# Patient Record
Sex: Male | Born: 1955 | Race: White | Hispanic: No | Marital: Married | Smoking: Never smoker
Health system: Southern US, Community
[De-identification: ages and names within clinical notes are randomized; demographics above are authoritative.]

## PROBLEM LIST (undated history)

## (undated) DIAGNOSIS — T4145XA Adverse effect of unspecified anesthetic, initial encounter: Secondary | ICD-10-CM

## (undated) DIAGNOSIS — G4730 Sleep apnea, unspecified: Secondary | ICD-10-CM

## (undated) DIAGNOSIS — I10 Essential (primary) hypertension: Secondary | ICD-10-CM

## (undated) DIAGNOSIS — N189 Chronic kidney disease, unspecified: Secondary | ICD-10-CM

## (undated) DIAGNOSIS — C801 Malignant (primary) neoplasm, unspecified: Secondary | ICD-10-CM

## (undated) DIAGNOSIS — M1990 Unspecified osteoarthritis, unspecified site: Secondary | ICD-10-CM

## (undated) DIAGNOSIS — D649 Anemia, unspecified: Secondary | ICD-10-CM

## (undated) DIAGNOSIS — T8859XA Other complications of anesthesia, initial encounter: Secondary | ICD-10-CM

## (undated) DIAGNOSIS — E119 Type 2 diabetes mellitus without complications: Secondary | ICD-10-CM

## (undated) HISTORY — PX: CHOLECYSTECTOMY: SHX55

## (undated) HISTORY — PX: MOHS SURGERY: SHX181

---
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
## 2004-04-14 ENCOUNTER — Ambulatory Visit: Payer: Self-pay

## 2004-09-23 ENCOUNTER — Ambulatory Visit: Payer: Self-pay | Admitting: Internal Medicine

## 2014-05-30 DIAGNOSIS — C61 Malignant neoplasm of prostate: Secondary | ICD-10-CM | POA: Insufficient documentation

## 2014-08-27 DIAGNOSIS — Z9989 Dependence on other enabling machines and devices: Secondary | ICD-10-CM | POA: Insufficient documentation

## 2016-03-09 DIAGNOSIS — E1121 Type 2 diabetes mellitus with diabetic nephropathy: Secondary | ICD-10-CM | POA: Insufficient documentation

## 2016-05-10 ENCOUNTER — Other Ambulatory Visit: Payer: Self-pay | Admitting: Orthopedic Surgery

## 2016-05-10 DIAGNOSIS — G8929 Other chronic pain: Secondary | ICD-10-CM

## 2016-05-10 DIAGNOSIS — M25561 Pain in right knee: Principal | ICD-10-CM

## 2016-05-11 ENCOUNTER — Ambulatory Visit: Payer: BLUE CROSS/BLUE SHIELD | Attending: Specialist

## 2016-05-11 DIAGNOSIS — G4733 Obstructive sleep apnea (adult) (pediatric): Secondary | ICD-10-CM | POA: Diagnosis not present

## 2016-05-11 DIAGNOSIS — G4730 Sleep apnea, unspecified: Secondary | ICD-10-CM | POA: Diagnosis present

## 2016-05-11 DIAGNOSIS — R0683 Snoring: Secondary | ICD-10-CM | POA: Diagnosis not present

## 2016-05-20 ENCOUNTER — Ambulatory Visit
Admission: RE | Admit: 2016-05-20 | Discharge: 2016-05-20 | Disposition: A | Discharge status: Home / Self Care | Payer: BLUE CROSS/BLUE SHIELD | Source: Ambulatory Visit | Attending: Orthopedic Surgery | Admitting: Orthopedic Surgery

## 2016-05-20 DIAGNOSIS — M7041 Prepatellar bursitis, right knee: Secondary | ICD-10-CM | POA: Insufficient documentation

## 2016-05-20 DIAGNOSIS — M7121 Synovial cyst of popliteal space [Baker], right knee: Secondary | ICD-10-CM | POA: Diagnosis not present

## 2016-05-20 DIAGNOSIS — M1711 Unilateral primary osteoarthritis, right knee: Secondary | ICD-10-CM | POA: Diagnosis not present

## 2016-05-20 DIAGNOSIS — M25561 Pain in right knee: Secondary | ICD-10-CM | POA: Insufficient documentation

## 2016-05-20 DIAGNOSIS — I739 Peripheral vascular disease, unspecified: Secondary | ICD-10-CM | POA: Diagnosis not present

## 2016-05-20 DIAGNOSIS — G8929 Other chronic pain: Secondary | ICD-10-CM | POA: Diagnosis present

## 2016-06-23 ENCOUNTER — Encounter
Admission: RE | Admit: 2016-06-23 | Discharge: 2016-06-23 | Disposition: A | Discharge status: Home / Self Care | Payer: BLUE CROSS/BLUE SHIELD | Source: Ambulatory Visit | Attending: Orthopedic Surgery | Admitting: Orthopedic Surgery

## 2016-06-23 DIAGNOSIS — I10 Essential (primary) hypertension: Secondary | ICD-10-CM | POA: Diagnosis not present

## 2016-06-23 DIAGNOSIS — Z01810 Encounter for preprocedural cardiovascular examination: Secondary | ICD-10-CM | POA: Insufficient documentation

## 2016-06-23 DIAGNOSIS — Z01812 Encounter for preprocedural laboratory examination: Secondary | ICD-10-CM | POA: Diagnosis present

## 2016-06-23 HISTORY — DX: Sleep apnea, unspecified: G47.30

## 2016-06-23 HISTORY — DX: Essential (primary) hypertension: I10

## 2016-06-23 HISTORY — DX: Unspecified osteoarthritis, unspecified site: M19.90

## 2016-06-23 HISTORY — DX: Malignant (primary) neoplasm, unspecified: C80.1

## 2016-06-23 LAB — PROTIME-INR
INR: 0.97
PROTHROMBIN TIME: 12.9 s (ref 11.4–15.2)

## 2016-06-23 LAB — CBC
HCT: 36.7 % — ABNORMAL LOW (ref 40.0–52.0)
HEMOGLOBIN: 12.9 g/dL — AB (ref 13.0–18.0)
MCH: 31.3 pg (ref 26.0–34.0)
MCHC: 35.1 g/dL (ref 32.0–36.0)
MCV: 89 fL (ref 80.0–100.0)
PLATELETS: 240 10*3/uL (ref 150–440)
RBC: 4.12 MIL/uL — AB (ref 4.40–5.90)
RDW: 14.1 % (ref 11.5–14.5)
WBC: 6.5 10*3/uL (ref 3.8–10.6)

## 2016-06-23 LAB — URINALYSIS, COMPLETE (UACMP) WITH MICROSCOPIC
BILIRUBIN URINE: NEGATIVE
Bacteria, UA: NONE SEEN
Glucose, UA: 50 mg/dL — AB
Hgb urine dipstick: NEGATIVE
KETONES UR: NEGATIVE mg/dL
Leukocytes, UA: NEGATIVE
Nitrite: NEGATIVE
RBC / HPF: NONE SEEN RBC/hpf (ref 0–5)
Specific Gravity, Urine: 1.009 (ref 1.005–1.030)
pH: 7 (ref 5.0–8.0)

## 2016-06-23 LAB — TYPE AND SCREEN
ABO/RH(D): AB POS
Antibody Screen: NEGATIVE

## 2016-06-23 LAB — SEDIMENTATION RATE: Sed Rate: 39 mm/hr — ABNORMAL HIGH (ref 0–20)

## 2016-06-23 LAB — BASIC METABOLIC PANEL
ANION GAP: 8 (ref 5–15)
BUN: 19 mg/dL (ref 6–20)
CALCIUM: 9 mg/dL (ref 8.9–10.3)
CO2: 28 mmol/L (ref 22–32)
Chloride: 103 mmol/L (ref 101–111)
Creatinine, Ser: 1.14 mg/dL (ref 0.61–1.24)
GFR calc Af Amer: >60 mL/min (ref >60)
GFR calc non Af Amer: >60 mL/min (ref >60)
Glucose, Bld: 148 mg/dL — ABNORMAL HIGH (ref 65–99)
POTASSIUM: 3.2 mmol/L — AB (ref 3.5–5.1)
Sodium: 139 mmol/L (ref 135–145)

## 2016-06-23 LAB — SURGICAL PCR SCREEN
MRSA, PCR: NEGATIVE
Staphylococcus aureus: NEGATIVE

## 2016-06-23 LAB — APTT: APTT: 30 s (ref 24–36)

## 2016-06-23 NOTE — Patient Instructions (Addendum)
Your procedure is scheduled on: 06/29/16 Tues Report to Same Day Surgery 2nd floor medical mall [facility] Entrance-take elevator on left to 2nd floor.  Check in with surgery information desk.) To find out your arrival time please call [phone] between 1PM - 3PM on 06/28/16 Mon Remember: Instructions that are not followed completely may result in serious medical risk, up to and including death, or upon the discretion of your surgeon and anesthesiologist your surgery may need to be rescheduled.    _x___ 1. Do not eat food or drink liquids after midnight. No gum chewing or                              hard candies.     __x__ 2. No Alcohol for 24 hours before or after surgery.   __x__3. No Smoking for 24 prior to surgery.   ____  4. Bring all medications with you on the day of surgery if instructed.    __x__ 5. Notify your doctor if there is any change in your medical condition     (cold, fever, infections).     Do not wear jewelry, make-up, hairpins, clips or nail polish.  Do not wear lotions, powders, or perfumes. You may wear deodorant.  Do not shave 48 hours prior to surgery. Men may shave face and neck.  Do not bring valuables to the hospital.    [facility] is not responsible for any belongings or valuables.               Contacts, dentures or bridgework may not be worn into surgery.  Leave your suitcase in the car. After surgery it may be brought to your room.  For patients admitted to the hospital, discharge time is determined by your                       treatment team.   Patients discharged the day of surgery will not be allowed to drive home.  You will need someone to drive you home and stay with you the night of your procedure.    Please read over the following fact sheets that you were given:   [facility] Preparing for Surgery and or MRSA Information   _x___ Take anti-hypertensive (unless it includes a diuretic), cardiac, seizure, asthma,     anti-reflux and  psychiatric medicines. These include:  1. amLODipine (NORVASC  2.carvedilol (COREG  3.lisinopril (PRINIVIL,ZESTRIL  4.  5.  6.  ____Fleets enema or Magnesium Citrate as directed.   _x___ Use CHG Soap or sage wipes as directed on instruction sheet   ____ Use inhalers on the day of surgery and bring to hospital day of surgery  _x_ Stop Metformin and Janumet 2 days prior to surgery.    ____ Take 1/2 of usual insulin dose the night before surgery and none on the morning     surgery.   _x___ Follow recommendations from Cardiologist, Pulmonologist or PCP regarding          stopping Aspirin, Coumadin, Pllavix ,Eliquis, Effient, or Pradaxa, and Pletal.  X____Stop Anti-inflammatories such as Advil, Aleve, Ibuprofen, Motrin, Naproxen, Naprosyn, Goodies powders or aspirin products. OK to take Tylenol and                          Celebrex.   _x___ Stop supplements until after surgery.  But may continue Vitamin D,  Vitamin B,       and multivitamin.  Stop Turmeric today.   __x__ Bring C-Pap to the hospital.

## 2016-06-24 LAB — URINE CULTURE: Culture: NO GROWTH

## 2016-06-24 NOTE — Pre-Procedure Instructions (Signed)
EKG /HISTORY DISCUSSED WITH DR Assunta Gambles. OK WITH EKG AND JUST WANTS MEDICAL OPTIMIZATION BY PCP. CALLED AND FAXED TO CONNIE AT DR B KLEIN'S OFFICE. LM FOR TIFFANY AT DR [facility]

## 2016-06-28 NOTE — Pre-Procedure Instructions (Signed)
Medical clearance received from Dr. Caryl Comes and patient risk assessment is low.Clearance placed on chart.

## 2016-06-29 ENCOUNTER — Inpatient Hospital Stay: Payer: BLUE CROSS/BLUE SHIELD | Admitting: Anesthesiology

## 2016-06-29 ENCOUNTER — Inpatient Hospital Stay
Admission: RE | Admit: 2016-06-29 | Discharge: 2016-07-01 | DRG: 470 | Disposition: A | Discharge status: Home Health | Payer: BLUE CROSS/BLUE SHIELD | Source: Ambulatory Visit | Attending: Orthopedic Surgery | Admitting: Orthopedic Surgery

## 2016-06-29 ENCOUNTER — Inpatient Hospital Stay: Payer: BLUE CROSS/BLUE SHIELD

## 2016-06-29 ENCOUNTER — Encounter
Admission: RE | Disposition: A | Discharge status: Home Health | Payer: Self-pay | Source: Ambulatory Visit | Attending: Orthopedic Surgery

## 2016-06-29 ENCOUNTER — Encounter: Payer: Self-pay | Admitting: *Deleted

## 2016-06-29 DIAGNOSIS — Z79899 Other long term (current) drug therapy: Secondary | ICD-10-CM

## 2016-06-29 DIAGNOSIS — E1121 Type 2 diabetes mellitus with diabetic nephropathy: Secondary | ICD-10-CM | POA: Diagnosis present

## 2016-06-29 DIAGNOSIS — M1711 Unilateral primary osteoarthritis, right knee: Principal | ICD-10-CM | POA: Diagnosis present

## 2016-06-29 DIAGNOSIS — Z8546 Personal history of malignant neoplasm of prostate: Secondary | ICD-10-CM

## 2016-06-29 DIAGNOSIS — I10 Essential (primary) hypertension: Secondary | ICD-10-CM | POA: Diagnosis present

## 2016-06-29 DIAGNOSIS — Z7984 Long term (current) use of oral hypoglycemic drugs: Secondary | ICD-10-CM

## 2016-06-29 DIAGNOSIS — E876 Hypokalemia: Secondary | ICD-10-CM | POA: Diagnosis not present

## 2016-06-29 DIAGNOSIS — D62 Acute posthemorrhagic anemia: Secondary | ICD-10-CM | POA: Diagnosis not present

## 2016-06-29 DIAGNOSIS — E119 Type 2 diabetes mellitus without complications: Secondary | ICD-10-CM | POA: Diagnosis present

## 2016-06-29 DIAGNOSIS — G8918 Other acute postprocedural pain: Secondary | ICD-10-CM

## 2016-06-29 DIAGNOSIS — G4730 Sleep apnea, unspecified: Secondary | ICD-10-CM | POA: Diagnosis present

## 2016-06-29 HISTORY — PX: TOTAL KNEE ARTHROPLASTY: SHX125

## 2016-06-29 LAB — POCT I-STAT 4, (NA,K, GLUC, HGB,HCT)
GLUCOSE: 154 mg/dL — AB (ref 65–99)
HEMATOCRIT: 33 % — AB (ref 39.0–52.0)
HEMOGLOBIN: 11.2 g/dL — AB (ref 13.0–17.0)
POTASSIUM: 3.6 mmol/L (ref 3.5–5.1)
Sodium: 141 mmol/L (ref 135–145)

## 2016-06-29 LAB — CBC
HCT: 34.8 % — ABNORMAL LOW (ref 40.0–52.0)
HEMOGLOBIN: 12.1 g/dL — AB (ref 13.0–18.0)
MCH: 31.2 pg (ref 26.0–34.0)
MCHC: 34.7 g/dL (ref 32.0–36.0)
MCV: 89.9 fL (ref 80.0–100.0)
Platelets: 223 10*3/uL (ref 150–440)
RBC: 3.87 MIL/uL — ABNORMAL LOW (ref 4.40–5.90)
RDW: 13.8 % (ref 11.5–14.5)
WBC: 15.4 10*3/uL — AB (ref 3.8–10.6)

## 2016-06-29 LAB — GLUCOSE, CAPILLARY
GLUCOSE-CAPILLARY: 147 mg/dL — AB (ref 65–99)
Glucose-Capillary: 146 mg/dL — ABNORMAL HIGH (ref 65–99)

## 2016-06-29 LAB — CREATININE, SERUM
Creatinine, Ser: 1.38 mg/dL — ABNORMAL HIGH (ref 0.61–1.24)
GFR calc Af Amer: >60 mL/min (ref >60)
GFR calc non Af Amer: 54 mL/min — ABNORMAL LOW (ref >60)

## 2016-06-29 LAB — ABO/RH: ABO/RH(D): AB POS

## 2016-06-29 SURGERY — ARTHROPLASTY, KNEE, TOTAL
Anesthesia: Spinal | Site: Knee | Laterality: Right | Wound class: Clean

## 2016-06-29 MED ORDER — BUPIVACAINE HCL (PF) 0.5 % IJ SOLN
INTRAMUSCULAR | Status: AC
  Filled 2016-06-29: qty 10

## 2016-06-29 MED ORDER — METOCLOPRAMIDE HCL 10 MG PO TABS: 5.0000 mg | ORAL_TABLET | Freq: Three times a day (TID) | ORAL | Status: DC | PRN

## 2016-06-29 MED ORDER — ALUM & MAG HYDROXIDE-SIMETH 200-200-20 MG/5ML PO SUSP
30.0000 mL | ORAL | Status: DC | PRN
  Administered 2016-06-30 – 2016-07-01 (×3): 30 mL via ORAL
  Filled 2016-06-29 (×3): qty 30

## 2016-06-29 MED ORDER — NEOMYCIN-POLYMYXIN B GU 40-200000 IR SOLN
Status: AC
Start: 2016-06-29 — End: 2016-06-29
  Filled 2016-06-29: qty 20

## 2016-06-29 MED ORDER — METFORMIN HCL 500 MG PO TABS
1000.0000 mg | ORAL_TABLET | Freq: Every day | ORAL | Status: DC
  Administered 2016-06-29 – 2016-07-01 (×3): 1000 mg via ORAL
  Filled 2016-06-29 (×3): qty 2

## 2016-06-29 MED ORDER — SODIUM CHLORIDE 0.9 % IJ SOLN
INTRAMUSCULAR | Status: AC
  Filled 2016-06-29: qty 10

## 2016-06-29 MED ORDER — FAMOTIDINE 20 MG PO TABS
ORAL_TABLET | ORAL | Status: AC
  Filled 2016-06-29: qty 1

## 2016-06-29 MED ORDER — CEFAZOLIN SODIUM-DEXTROSE 2-3 GM-% IV SOLR
2.0000 g | Freq: Four times a day (QID) | INTRAVENOUS | Status: AC
  Administered 2016-06-29 (×3): 2 g via INTRAVENOUS
  Filled 2016-06-29 (×3): qty 50

## 2016-06-29 MED ORDER — GLYCOPYRROLATE 0.2 MG/ML IJ SOLN
INTRAMUSCULAR | Status: AC
  Filled 2016-06-29: qty 1

## 2016-06-29 MED ORDER — FAMOTIDINE 20 MG PO TABS
20.0000 mg | ORAL_TABLET | Freq: Once | ORAL | Status: AC
  Administered 2016-06-29: 20 mg via ORAL

## 2016-06-29 MED ORDER — BUPIVACAINE-EPINEPHRINE (PF) 0.25% -1:200000 IJ SOLN
INTRAMUSCULAR | Status: AC
  Filled 2016-06-29: qty 30

## 2016-06-29 MED ORDER — AMLODIPINE BESYLATE 5 MG PO TABS
5.0000 mg | ORAL_TABLET | Freq: Two times a day (BID) | ORAL | Status: DC
  Administered 2016-06-30 – 2016-07-01 (×3): 5 mg via ORAL
  Filled 2016-06-29 (×3): qty 1

## 2016-06-29 MED ORDER — MIDAZOLAM HCL 2 MG/2ML IJ SOLN
INTRAMUSCULAR | Status: AC
  Filled 2016-06-29: qty 2

## 2016-06-29 MED ORDER — CARVEDILOL 25 MG PO TABS
50.0000 mg | ORAL_TABLET | Freq: Two times a day (BID) | ORAL | Status: DC
  Administered 2016-06-29 – 2016-07-01 (×5): 50 mg via ORAL
  Filled 2016-06-29 (×5): qty 2

## 2016-06-29 MED ORDER — EPHEDRINE SULFATE 50 MG/ML IJ SOLN
INTRAMUSCULAR | Status: AC
  Filled 2016-06-29: qty 1

## 2016-06-29 MED ORDER — METOCLOPRAMIDE HCL 5 MG/ML IJ SOLN: 5.0000 mg | Freq: Three times a day (TID) | INTRAMUSCULAR | Status: DC | PRN

## 2016-06-29 MED ORDER — MAGNESIUM HYDROXIDE 400 MG/5ML PO SUSP
30.0000 mL | Freq: Every day | ORAL | Status: DC | PRN
  Administered 2016-06-30 – 2016-07-01 (×2): 30 mL via ORAL
  Filled 2016-06-29 (×3): qty 30

## 2016-06-29 MED ORDER — PROPOFOL 500 MG/50ML IV EMUL
INTRAVENOUS | Status: DC | PRN
Start: 2016-06-29 — End: 2016-06-29
  Administered 2016-06-29: 50 ug/kg/min via INTRAVENOUS

## 2016-06-29 MED ORDER — MIDAZOLAM HCL 5 MG/5ML IJ SOLN
INTRAMUSCULAR | Status: DC | PRN
  Administered 2016-06-29: 2 mg via INTRAVENOUS
  Administered 2016-06-29 (×2): 1 mg via INTRAVENOUS

## 2016-06-29 MED ORDER — FENTANYL CITRATE (PF) 100 MCG/2ML IJ SOLN
INTRAMUSCULAR | Status: DC | PRN
  Administered 2016-06-29 (×2): 50 ug via INTRAVENOUS

## 2016-06-29 MED ORDER — BUPIVACAINE-EPINEPHRINE (PF) 0.25% -1:200000 IJ SOLN
INTRAMUSCULAR | Status: DC | PRN
  Administered 2016-06-29: 30 mL

## 2016-06-29 MED ORDER — GLYCOPYRROLATE 0.2 MG/ML IJ SOLN
INTRAMUSCULAR | Status: DC | PRN
  Administered 2016-06-29: 0.2 mg via INTRAVENOUS

## 2016-06-29 MED ORDER — DIPHENHYDRAMINE HCL 12.5 MG/5ML PO ELIX: 12.5000 mg | ORAL_SOLUTION | ORAL | Status: DC | PRN

## 2016-06-29 MED ORDER — SODIUM CHLORIDE 0.9 % IV SOLN
INTRAVENOUS | Status: DC
  Administered 2016-06-29 (×2): via INTRAVENOUS

## 2016-06-29 MED ORDER — SODIUM CHLORIDE 0.9 % IJ SOLN
INTRAMUSCULAR | Status: AC
Start: 2016-06-29 — End: 2016-06-29
  Filled 2016-06-29: qty 100

## 2016-06-29 MED ORDER — BISACODYL 10 MG RE SUPP: 10.0000 mg | Freq: Every day | RECTAL | Status: DC | PRN

## 2016-06-29 MED ORDER — MORPHINE SULFATE 10 MG/ML IJ SOLN
INTRAMUSCULAR | Status: DC | PRN
  Administered 2016-06-29: 10 mg

## 2016-06-29 MED ORDER — PHENYLEPHRINE HCL 10 MG/ML IJ SOLN
INTRAMUSCULAR | Status: AC
  Filled 2016-06-29: qty 2

## 2016-06-29 MED ORDER — METHOCARBAMOL 1000 MG/10ML IJ SOLN
500.0000 mg | Freq: Four times a day (QID) | INTRAVENOUS | Status: DC | PRN
  Filled 2016-06-29: qty 5

## 2016-06-29 MED ORDER — LIDOCAINE HCL 2 % EX GEL
CUTANEOUS | Status: DC | PRN
  Administered 2016-06-29: 1 via TOPICAL

## 2016-06-29 MED ORDER — ONDANSETRON HCL 4 MG/2ML IJ SOLN
4.0000 mg | Freq: Four times a day (QID) | INTRAMUSCULAR | Status: DC | PRN
  Administered 2016-06-29 – 2016-06-30 (×2): 4 mg via INTRAVENOUS
  Filled 2016-06-29 (×3): qty 2

## 2016-06-29 MED ORDER — ZOLPIDEM TARTRATE 5 MG PO TABS
5.0000 mg | ORAL_TABLET | Freq: Every evening | ORAL | Status: DC | PRN
  Administered 2016-06-29: 5 mg via ORAL
  Filled 2016-06-29: qty 1

## 2016-06-29 MED ORDER — ENOXAPARIN SODIUM 30 MG/0.3ML ~~LOC~~ SOLN
30.0000 mg | Freq: Two times a day (BID) | SUBCUTANEOUS | Status: DC
  Administered 2016-06-30 – 2016-07-01 (×3): 30 mg via SUBCUTANEOUS
  Filled 2016-06-29 (×3): qty 0.3

## 2016-06-29 MED ORDER — SODIUM CHLORIDE 0.9 % IV SOLN
INTRAVENOUS | Status: DC
  Administered 2016-06-29 (×2): via INTRAVENOUS

## 2016-06-29 MED ORDER — TRANEXAMIC ACID 1000 MG/10ML IV SOLN
1000.0000 mg | INTRAVENOUS | Status: AC
  Administered 2016-06-29: 1000 mg via INTRAVENOUS
  Filled 2016-06-29: qty 10

## 2016-06-29 MED ORDER — PROPOFOL 500 MG/50ML IV EMUL
INTRAVENOUS | Status: AC
  Filled 2016-06-29: qty 50

## 2016-06-29 MED ORDER — DOCUSATE SODIUM 100 MG PO CAPS
100.0000 mg | ORAL_CAPSULE | Freq: Two times a day (BID) | ORAL | Status: DC
Start: 2016-06-29 — End: 2016-07-01
  Administered 2016-06-29 – 2016-07-01 (×4): 100 mg via ORAL
  Filled 2016-06-29 (×4): qty 1

## 2016-06-29 MED ORDER — MAGNESIUM CITRATE PO SOLN
1.0000 | Freq: Once | ORAL | Status: DC | PRN
Start: 2016-06-29 — End: 2016-07-01
  Filled 2016-06-29: qty 296

## 2016-06-29 MED ORDER — SODIUM CHLORIDE 0.9 % IV SOLN
INTRAVENOUS | Status: DC | PRN
  Administered 2016-06-29: 60 mL

## 2016-06-29 MED ORDER — CEFAZOLIN SODIUM-DEXTROSE 2-4 GM/100ML-% IV SOLN
INTRAVENOUS | Status: AC
  Filled 2016-06-29: qty 100

## 2016-06-29 MED ORDER — MORPHINE SULFATE (PF) 2 MG/ML IV SOLN
2.0000 mg | INTRAVENOUS | Status: DC | PRN
  Administered 2016-06-29: 2 mg via INTRAVENOUS
  Filled 2016-06-29: qty 1

## 2016-06-29 MED ORDER — BUPIVACAINE HCL (PF) 0.5 % IJ SOLN
INTRAMUSCULAR | Status: DC | PRN
  Administered 2016-06-29: 3 mL via INTRATHECAL

## 2016-06-29 MED ORDER — METHOCARBAMOL 500 MG PO TABS: 500.0000 mg | ORAL_TABLET | Freq: Four times a day (QID) | ORAL | Status: DC | PRN

## 2016-06-29 MED ORDER — MORPHINE SULFATE (PF) 10 MG/ML IV SOLN
INTRAVENOUS | Status: AC
  Filled 2016-06-29: qty 1

## 2016-06-29 MED ORDER — BUPIVACAINE LIPOSOME 1.3 % IJ SUSP
INTRAMUSCULAR | Status: AC
  Filled 2016-06-29: qty 20

## 2016-06-29 MED ORDER — TRAMADOL HCL 50 MG PO TABS
100.0000 mg | ORAL_TABLET | Freq: Four times a day (QID) | ORAL | Status: DC
  Administered 2016-06-29 – 2016-07-01 (×10): 100 mg via ORAL
  Filled 2016-06-29 (×10): qty 2

## 2016-06-29 MED ORDER — LIDOCAINE HCL 2 % EX GEL
CUTANEOUS | Status: AC
  Filled 2016-06-29: qty 5

## 2016-06-29 MED ORDER — TURMERIC 500 MG PO CAPS: 2.0000 | ORAL_CAPSULE | Freq: Every day | ORAL | Status: DC

## 2016-06-29 MED ORDER — OXYCODONE HCL 5 MG PO TABS: 5.0000 mg | ORAL_TABLET | ORAL | Status: DC | PRN

## 2016-06-29 MED ORDER — LIDOCAINE HCL (PF) 2 % IJ SOLN
INTRAMUSCULAR | Status: AC
  Filled 2016-06-29: qty 2

## 2016-06-29 MED ORDER — MENTHOL 3 MG MT LOZG
1.0000 | LOZENGE | OROMUCOSAL | Status: DC | PRN
  Filled 2016-06-29: qty 9

## 2016-06-29 MED ORDER — ONDANSETRON HCL 4 MG/2ML IJ SOLN: 4.0000 mg | Freq: Once | INTRAMUSCULAR | Status: DC | PRN

## 2016-06-29 MED ORDER — PHENOL 1.4 % MT LIQD
1.0000 | OROMUCOSAL | Status: DC | PRN
  Filled 2016-06-29: qty 177

## 2016-06-29 MED ORDER — HYDRALAZINE HCL 25 MG PO TABS
75.0000 mg | ORAL_TABLET | Freq: Two times a day (BID) | ORAL | Status: DC
  Administered 2016-06-29 – 2016-07-01 (×5): 75 mg via ORAL
  Filled 2016-06-29 (×5): qty 1

## 2016-06-29 MED ORDER — NEOMYCIN-POLYMYXIN B GU 40-200000 IR SOLN
Status: DC | PRN
  Administered 2016-06-29: 16 mL

## 2016-06-29 MED ORDER — FENTANYL CITRATE (PF) 100 MCG/2ML IJ SOLN: 25.0000 ug | INTRAMUSCULAR | Status: DC | PRN

## 2016-06-29 MED ORDER — ACETAMINOPHEN 325 MG PO TABS
650.0000 mg | ORAL_TABLET | Freq: Four times a day (QID) | ORAL | Status: DC | PRN
Start: 2016-06-29 — End: 2016-07-01
  Administered 2016-06-29: 650 mg via ORAL
  Filled 2016-06-29: qty 2

## 2016-06-29 MED ORDER — TRIAMTERENE-HCTZ 37.5-25 MG PO TABS
2.0000 | ORAL_TABLET | Freq: Every day | ORAL | Status: DC
  Administered 2016-06-30: 1 via ORAL
  Filled 2016-06-29: qty 2

## 2016-06-29 MED ORDER — CLONIDINE HCL 0.1 MG PO TABS
0.2000 mg | ORAL_TABLET | Freq: Two times a day (BID) | ORAL | Status: DC | PRN
  Administered 2016-06-29: 0.2 mg via ORAL
  Filled 2016-06-29: qty 2

## 2016-06-29 MED ORDER — FUROSEMIDE 20 MG PO TABS
10.0000 mg | ORAL_TABLET | Freq: Every day | ORAL | Status: DC
  Administered 2016-06-30 – 2016-07-01 (×2): 10 mg via ORAL
  Filled 2016-06-29 (×2): qty 1

## 2016-06-29 MED ORDER — CEFAZOLIN SODIUM-DEXTROSE 2-4 GM/100ML-% IV SOLN
2.0000 g | Freq: Once | INTRAVENOUS | Status: AC
Start: 2016-06-29 — End: 2016-06-29
  Administered 2016-06-29: 2 g via INTRAVENOUS

## 2016-06-29 MED ORDER — LIDOCAINE HCL (PF) 2 % IJ SOLN
INTRAMUSCULAR | Status: DC | PRN
  Administered 2016-06-29: 50 mg

## 2016-06-29 MED ORDER — CEFAZOLIN SODIUM-DEXTROSE 2-4 GM/100ML-% IV SOLN: 2.0000 g | Freq: Four times a day (QID) | INTRAVENOUS | Status: DC

## 2016-06-29 MED ORDER — SALINE SPRAY 0.65 % NA SOLN
1.0000 | Freq: Every evening | NASAL | Status: DC | PRN
  Filled 2016-06-29: qty 44

## 2016-06-29 MED ORDER — LISINOPRIL 20 MG PO TABS
20.0000 mg | ORAL_TABLET | Freq: Every day | ORAL | Status: DC
  Administered 2016-06-30 – 2016-07-01 (×2): 20 mg via ORAL
  Filled 2016-06-29 (×2): qty 1

## 2016-06-29 MED ORDER — PROPOFOL 10 MG/ML IV BOLUS
INTRAVENOUS | Status: AC
Start: 2016-06-29 — End: 2016-06-29
  Filled 2016-06-29: qty 20

## 2016-06-29 MED ORDER — ACETAMINOPHEN 650 MG RE SUPP: 650.0000 mg | Freq: Four times a day (QID) | RECTAL | Status: DC | PRN

## 2016-06-29 MED ORDER — ONDANSETRON HCL 4 MG PO TABS: 4.0000 mg | ORAL_TABLET | Freq: Four times a day (QID) | ORAL | Status: DC | PRN

## 2016-06-29 MED ORDER — FENTANYL CITRATE (PF) 100 MCG/2ML IJ SOLN
INTRAMUSCULAR | Status: AC
  Filled 2016-06-29: qty 2

## 2016-06-29 SURGICAL SUPPLY — 65 items
BANDAGE ACE 6X5 VEL STRL LF (GAUZE/BANDAGES/DRESSINGS) ×2 IMPLANT
BLADE SAW 1 (BLADE) ×2 IMPLANT
BLOCK CUTTING TIBIAL 4 RT MIS (MISCELLANEOUS) IMPLANT
BLOCK CUTTING TIBIAL 5 RT (MISCELLANEOUS) IMPLANT
BOWL CEMENT MIXING ADV NOZZLE (MISCELLANEOUS) ×1 IMPLANT
CANISTER SUCT 1200ML W/VALVE (MISCELLANEOUS) ×2 IMPLANT
CANISTER SUCT 3000ML (MISCELLANEOUS) ×4 IMPLANT
CAPT KNEE TOTAL 3 ×1 IMPLANT
CATH FOL LEG HOLDER (MISCELLANEOUS) ×2 IMPLANT
CATH TRAY METER 16FR LF (MISCELLANEOUS) ×2 IMPLANT
CEMENT BONE GENTAMICIN (Cement) ×4 IMPLANT
CEMENT BONE GENTAMICIN PWDR (Cement) IMPLANT
CHLORAPREP W/TINT 26ML (MISCELLANEOUS) ×4 IMPLANT
COOLER POLAR GLACIER W/PUMP (MISCELLANEOUS) ×2 IMPLANT
CUFF TOURN 24 STER (MISCELLANEOUS) IMPLANT
CUFF TOURN 30 STER DUAL PORT (MISCELLANEOUS) ×1 IMPLANT
DRAPE INCISE IOBAN 66X45 STRL (DRAPES) ×4 IMPLANT
DRAPE SHEET LG 3/4 BI-LAMINATE (DRAPES) ×4 IMPLANT
ELECT CAUTERY BLADE 6.4 (BLADE) ×2 IMPLANT
ELECT REM PT RETURN 9FT ADLT (ELECTROSURGICAL) ×2
ELECTRODE REM PT RTRN 9FT ADLT (ELECTROSURGICAL) ×1 IMPLANT
FEMUR CUTTING BLOCK IMPLANT
GAUZE PETRO XEROFOAM 1X8 (MISCELLANEOUS) ×2 IMPLANT
GAUZE SPONGE 4X4 12PLY STRL (GAUZE/BANDAGES/DRESSINGS) ×2 IMPLANT
GLOVE BIOGEL PI IND STRL 9 (GLOVE) ×1 IMPLANT
GLOVE BIOGEL PI INDICATOR 9 (GLOVE) ×1
GLOVE INDICATOR 8.0 STRL GRN (GLOVE) ×2 IMPLANT
GLOVE SURG ORTHO 8.0 STRL STRW (GLOVE) ×2 IMPLANT
GLOVE SURG SYN 9.0  PF PI (GLOVE) ×1
GLOVE SURG SYN 9.0 PF PI (GLOVE) ×1 IMPLANT
GOWN SRG 2XL LVL 4 RGLN SLV (GOWNS) ×1 IMPLANT
GOWN STRL NON-REIN 2XL LVL4 (GOWNS) ×2
GOWN STRL REUS W/ TWL LRG LVL3 (GOWN DISPOSABLE) ×1 IMPLANT
GOWN STRL REUS W/ TWL XL LVL3 (GOWN DISPOSABLE) ×1 IMPLANT
GOWN STRL REUS W/TWL LRG LVL3 (GOWN DISPOSABLE) ×2
GOWN STRL REUS W/TWL XL LVL3 (GOWN DISPOSABLE) ×2
HANDPIECE INTERPULSE COAX TIP (DISPOSABLE) ×2
HOOD PEEL AWAY FLYTE STAYCOOL (MISCELLANEOUS) ×4 IMPLANT
IMMBOLIZER KNEE 19 BLUE UNIV (SOFTGOODS) ×2 IMPLANT
KIT RM TURNOVER STRD PROC AR (KITS) ×2 IMPLANT
KNEE MEDACTA TIBIAL/FEMORAL BL (Knees) ×1 IMPLANT
KNIFE SCULPS 14X20 (INSTRUMENTS) ×2 IMPLANT
NDL SAFETY 18GX1.5 (NEEDLE) ×2 IMPLANT
NDL SPNL 18GX3.5 QUINCKE PK (NEEDLE) ×1 IMPLANT
NDL SPNL 20GX3.5 QUINCKE YW (NEEDLE) ×1 IMPLANT
NEEDLE SPNL 18GX3.5 QUINCKE PK (NEEDLE) ×2 IMPLANT
NEEDLE SPNL 20GX3.5 QUINCKE YW (NEEDLE) ×2 IMPLANT
NS IRRIG 1000ML POUR BTL (IV SOLUTION) ×2 IMPLANT
PACK TOTAL KNEE (MISCELLANEOUS) ×2 IMPLANT
PAD WRAPON POLAR KNEE (MISCELLANEOUS) ×1 IMPLANT
SET HNDPC FAN SPRY TIP SCT (DISPOSABLE) ×1 IMPLANT
SOL .9 NS 3000ML IRR  AL (IV SOLUTION) ×1
SOL .9 NS 3000ML IRR AL (IV SOLUTION) ×1
SOL .9 NS 3000ML IRR UROMATIC (IV SOLUTION) ×1 IMPLANT
STAPLER SKIN PROX 35W (STAPLE) ×2 IMPLANT
SUCTION FRAZIER HANDLE 10FR (MISCELLANEOUS) ×1
SUCTION TUBE FRAZIER 10FR DISP (MISCELLANEOUS) ×1 IMPLANT
SUT DVC 2 QUILL PDO  T11 36X36 (SUTURE) ×1
SUT DVC 2 QUILL PDO T11 36X36 (SUTURE) ×1 IMPLANT
SUT V-LOC 90 ABS DVC 3-0 CL (SUTURE) ×2 IMPLANT
SYR 20CC LL (SYRINGE) ×2 IMPLANT
SYR 50ML LL SCALE MARK (SYRINGE) ×4 IMPLANT
TOWEL OR 17X26 4PK STRL BLUE (TOWEL DISPOSABLE) ×2 IMPLANT
TOWER CARTRIDGE SMART MIX (DISPOSABLE) ×2 IMPLANT
WRAPON POLAR PAD KNEE (MISCELLANEOUS) ×2

## 2016-06-29 NOTE — Anesthesia Preprocedure Evaluation (Signed)
Anesthesia Evaluation  Patient identified by MRN, date of birth, ID band Patient awake    Reviewed: Allergy & Precautions, NPO status , Patient's Chart, lab work & pertinent test results  History of Anesthesia Complications Negative for: history of anesthetic complications  Airway Mallampati: III       Dental   Pulmonary sleep apnea and Continuous Positive Airway Pressure Ventilation ,           Cardiovascular hypertension, Pt. on medications and Pt. on home beta blockers (-) Past MI (-) dysrhythmias (-) Valvular Problems/Murmurs     Neuro/Psych negative neurological ROS     GI/Hepatic negative GI ROS, Neg liver ROS,   Endo/Other  diabetes, Type 2, Oral Hypoglycemic Agents  Renal/GU negative Renal ROS     Musculoskeletal   Abdominal   Peds  Hematology negative hematology ROS (+)   Anesthesia Other Findings   Reproductive/Obstetrics                             Anesthesia Physical Anesthesia Plan  ASA: III  Anesthesia Plan: Spinal   Post-op Pain Management:    Induction:   Airway Management Planned:   Additional Equipment:   Intra-op Plan:   Post-operative Plan:   Informed Consent: I have reviewed the patients History and Physical, chart, labs and discussed the procedure including the risks, benefits and alternatives for the proposed anesthesia with the patient or authorized representative who has indicated his/her understanding and acceptance.     Plan Discussed with:   Anesthesia Plan Comments:         Anesthesia Quick Evaluation

## 2016-06-29 NOTE — OR Nursing (Signed)
Clin Engr in @ [phone] to check C-Pap and took it to PACU

## 2016-06-29 NOTE — Progress Notes (Signed)
Dr. Rudene Christians paged and responded. This Probation officer requested toradol and tramadol for Eric Russell and Eric Russell's wife is concerned about the effects of the ordered opiates on Eric Russell's sleep apnea. Received order for tramadol q6h scheduled. Eric Russell has received iv morphine and po hydralazine, po tylenol and iv zofran since arrival, Eric Russell has had two episodes of greenish emesis. Bone foam removed due to Eric Russell discomfort. Eric Russell has call bell in reach.

## 2016-06-29 NOTE — Anesthesia Post-op Follow-up Note (Cosign Needed)
Anesthesia QCDR form completed.        

## 2016-06-29 NOTE — Progress Notes (Signed)
Pt arrived from the OR at approx 1130 via bed. Pt alert and oriented on arrival, wife at bedside. Pt is placed on continuous pulse ox per order. Pt's wife is concerned about pain control and the use of opiates with pt's sleep apnea. This Probation officer reviewed md orders with pt and his wife. Pt nauseous on arrival, had small amount of green emesis. Pt is on O2 at [facility], lungs clear bilat ,Hr is regular, abdomen is soft, bs hypoactive. Foley draining clear yellow urine. R knee has dressing and polar care intact, provena woundvac also intact. Pt is able ot move his toes on arrival, ted and foot pumps in place. piv #20 intact to l wrist with iv ns hung per md order, site is free of redness and swelling.

## 2016-06-29 NOTE — H&P (Signed)
Reviewed paper H+P, will be scanned into chart. Patient examined, unchanged. No changes noted.

## 2016-06-29 NOTE — NC FL2 (Signed)
[place] LEVEL OF CARE SCREENING TOOL     IDENTIFICATION  Patient Name: Eric Russell Birthdate: 11-04-1955 Sex: male Admission Date (Current Location): 06/29/2016  [place] and [place] Number:  Engineering geologist and Address:  [facility], [address] [place], [place] [address]      Provider Number: [id number]  Attending Physician Name and Address:  Hessie Knows, MD  Relative Name and Phone Number:       Current Level of Care: Hospital Recommended Level of Care: [place] Prior Approval Number:    Date Approved/Denied:   PASRR Number:  [id number])  Discharge Plan: SNF    Current Diagnoses: Patient Active Problem List   Diagnosis Date Noted  . Primary localized osteoarthritis of right knee 06/29/2016    Orientation RESPIRATION BLADDER Height & Weight     Self, Time, Situation, Place  Normal Incontinent, External catheter Weight: 247 lb (112 kg) Height:  5\' 10"  (177.8 cm)  BEHAVIORAL SYMPTOMS/MOOD NEUROLOGICAL BOWEL NUTRITION STATUS   (None. )  (None. ) Incontinent Diet (Diet: Clear Liquid)  AMBULATORY STATUS COMMUNICATION OF NEEDS Skin   Extensive Assist Verbally Surgical wounds (Incision: Right Knee)                       Personal Care Assistance Level of Assistance  Bathing, Feeding, Dressing Bathing Assistance: Limited assistance Feeding assistance: Independent Dressing Assistance: Limited assistance     Functional Limitations Info  Sight, Hearing, Speech Sight Info: Adequate Hearing Info: Adequate Speech Info: Adequate    SPECIAL CARE FACTORS FREQUENCY  PT (By licensed PT), OT (By licensed OT)     PT Frequency:  (5) OT Frequency:  (5)            Contractures      Additional Factors Info  Code Status, Allergies Code Status Info:  (Full Code) Allergies Info:  (No Known Allergies)           Current Medications (06/29/2016):  This is the current hospital active  medication list Current Facility-Administered Medications  Medication Dose Route Frequency Provider Last Rate Last Dose  . 0.9 %  sodium chloride infusion   Intravenous Continuous Hessie Knows, MD      . acetaminophen (TYLENOL) tablet 650 mg  650 mg Oral Q6H PRN Hessie Knows, MD       Or  . acetaminophen (TYLENOL) suppository 650 mg  650 mg Rectal Q6H PRN Hessie Knows, MD      . alum & mag hydroxide-simeth (MAALOX/MYLANTA) 200-200-20 MG/5ML suspension 30 mL  30 mL Oral Q4H PRN Hessie Knows, MD      . Derrill Memo ON 06/30/2016] amLODipine (NORVASC) tablet 5 mg  5 mg Oral BID Hessie Knows, MD      . bisacodyl (DULCOLAX) suppository 10 mg  10 mg Rectal Daily PRN Hessie Knows, MD      . carvedilol (COREG) tablet 50 mg  50 mg Oral BID WC Hessie Knows, MD      . ceFAZolin (ANCEF) IVPB 2 g/50 mL premix  2 g Intravenous Q6H Hessie Knows, MD      . diphenhydrAMINE (BENADRYL) 12.5 MG/5ML elixir 12.5-25 mg  12.5-25 mg Oral Q4H PRN Hessie Knows, MD      . docusate sodium (COLACE) capsule 100 mg  100 mg Oral BID Hessie Knows, MD      . Derrill Memo ON 06/30/2016] enoxaparin (LOVENOX) injection 30 mg  30 mg Subcutaneous Q12H Hessie Knows, MD      .  famotidine (PEPCID) 20 MG tablet           . [START ON 06/30/2016] furosemide (LASIX) tablet 10 mg  10 mg Oral Daily Hessie Knows, MD      . hydrALAZINE (APRESOLINE) tablet 75 mg  75 mg Oral BID Hessie Knows, MD      . Derrill Memo ON 06/30/2016] lisinopril (PRINIVIL,ZESTRIL) tablet 20 mg  20 mg Oral Daily Hessie Knows, MD      . magnesium citrate solution 1 Bottle  1 Bottle Oral Once PRN Hessie Knows, MD      . magnesium hydroxide (MILK OF MAGNESIA) suspension 30 mL  30 mL Oral Daily PRN Hessie Knows, MD      . menthol-cetylpyridinium (CEPACOL) lozenge 3 mg  1 lozenge Oral PRN Hessie Knows, MD       Or  . phenol (CHLORASEPTIC) mouth spray 1 spray  1 spray Mouth/Throat PRN Hessie Knows, MD      . metFORMIN (GLUCOPHAGE) tablet 1,000 mg  1,000 mg Oral Q supper Hessie Knows, MD      .  methocarbamol (ROBAXIN) tablet 500 mg  500 mg Oral Q6H PRN Hessie Knows, MD       Or  . methocarbamol (ROBAXIN) 500 mg in dextrose 5 % 50 mL IVPB  500 mg Intravenous Q6H PRN Hessie Knows, MD      . metoCLOPramide (REGLAN) tablet 5-10 mg  5-10 mg Oral Q8H PRN Hessie Knows, MD       Or  . metoCLOPramide (REGLAN) injection 5-10 mg  5-10 mg Intravenous Q8H PRN Hessie Knows, MD      . morphine 2 MG/ML injection 2 mg  2 mg Intravenous Q1H PRN Hessie Knows, MD      . ondansetron [facility]) tablet 4 mg  4 mg Oral Q6H PRN Hessie Knows, MD       Or  . ondansetron [facility]) injection 4 mg  4 mg Intravenous Q6H PRN Hessie Knows, MD      . oxyCODONE (Oxy IR/ROXICODONE) immediate release tablet 5-10 mg  5-10 mg Oral Q3H PRN Hessie Knows, MD      . sodium chloride (OCEAN) 0.65 % nasal spray 1 spray  1 spray Each Nare QHS PRN Hessie Knows, MD      . Derrill Memo ON 06/30/2016] triamterene-hydrochlorothiazide (MAXZIDE-25) 37.5-25 MG per tablet 2 tablet  2 tablet Oral Daily Hessie Knows, MD      . zolpidem (AMBIEN) tablet 5 mg  5 mg Oral QHS PRN,MR X 1 Hessie Knows, MD         Discharge Medications: Please see discharge summary for a list of discharge medications.  Relevant Imaging Results:  Relevant Lab Results:   Additional Information  (SSN: [id number])  Danie Chandler, Student-Social Work

## 2016-06-29 NOTE — Progress Notes (Signed)
Pt became nauseous after dinner, vomited, received zofran. Pt felt better after emesis. Pt remains oob to chair, call bell in reach. Pt now on room air.

## 2016-06-29 NOTE — Progress Notes (Signed)
PHARMACIST - PHYSICIAN ORDER COMMUNICATION  CONCERNING: P&T Medication Policy on Herbal Medications  DESCRIPTION:  This patient's order for:  Turmeric CAPS 2 capsule  has been noted.  This product(s) is classified as an "herbal" or natural product. Due to a lack of definitive safety studies or FDA approval, nonstandard manufacturing practices, plus the potential risk of unknown drug-drug interactions while on inpatient medications, the Pharmacy and Therapeutics Committee does not permit the use of "herbal" or natural products of this type within [facility].   ACTION TAKEN: The pharmacy department is unable to verify this order at this time and your patient has been informed of this safety policy. Please reevaluate patient's clinical condition at discharge and address if the herbal or natural product(s) should be resumed at that time.  Pernell Dupre, PharmD, BCPS Clinical Pharmacist 06/29/2016 11:28 AM

## 2016-06-29 NOTE — Transfer of Care (Signed)
Immediate Anesthesia Transfer of Care Note  Patient: Golda Acre  Procedure(s) Performed: Procedure(s): TOTAL KNEE ARTHROPLASTY (Right)  Patient Location: PACU  Anesthesia Type:Spinal  Level of Consciousness: awake and alert   Airway & Oxygen Therapy: Patient Spontanous Breathing and Patient connected to face mask oxygen  Post-op Assessment: Report given to RN and Post -op Vital signs reviewed and stable  Post vital signs: Reviewed  Last Vitals:  Vitals:   06/29/16 0559 06/29/16 0957  BP: (!) 156/66 128/74  Pulse: 63 (!) 54  Resp: 16 16  Temp: 36.8 C 36.3 C    Last Pain:  Vitals:   06/29/16 0559  TempSrc: Oral         Complications: No apparent anesthesia complications

## 2016-06-29 NOTE — OR Nursing (Signed)
Lab tech in [phone] for abo/rh draw, and extra tube for POCT K+ (results 3.6)  To OR with warming cap in place, Thigh high stocking on non-op/left leg (other stkg on chart)  Sacral dressing to OR with patient  Clinical Engr called re c-pap  Ancef 2 gm and TXA to OR with patient

## 2016-06-29 NOTE — Evaluation (Signed)
Physical Therapy Evaluation Patient Details Name: Eric Russell MRN: [id number] DOB: Oct 27, 1955 Today's Date: 06/29/2016   History of Present Illness  Pt underwent R TKR without some post-op nausea and vomiting. Pt is POD#0 at time of initial evaluation  Clinical Impression  Pt admitted with above diagnosis. Pt currently with functional limitations due to the deficits listed below (see PT Problem List).  Pt demonstrates good effort with therapist. He requires minA+1 for bed mobility and transfers. He is CGA only for short ambulation from bed to recliner. Pt moves slowly today with decreased weight shift to RLE. He reports decrease in R knee pain from 7/10 to 5/10 following therapy session. Pt will benefit from skilled PT services to address deficits in strength, balance, and mobility in order to return to full function at home.     Follow Up Recommendations Home health PT;Supervision - Intermittent    Equipment Recommendations  Rolling walker with 5" wheels;3in1 (PT)    Recommendations for Other Services OT consult     Precautions / Restrictions Precautions Precautions: Knee Precaution Booklet Issued: Yes (comment) Required Braces or Orthoses: Knee Immobilizer - Right Knee Immobilizer - Right: Discontinue once straight leg raise with < 10 degree lag Restrictions Weight Bearing Restrictions: Yes RLE Weight Bearing: Weight bearing as tolerated      Mobility  Bed Mobility Overal bed mobility: Needs Assistance Bed Mobility: Supine to Sit     Supine to sit: Min assist     General bed mobility comments: Pt requires UE support and cues to come upright to sitting. Denies dizziness with position changes.   Transfers Overall transfer level: Needs assistance Equipment used: Rolling walker (2 wheeled) Transfers: Sit to/from Stand Sit to Stand: Min assist         General transfer comment: Pt requires minA+1 due to weakness in order to come upright to standing. Cues for safe  hand placement during transfers. Once upright pt is steady and stable in standing with bilateral UE support on walker  Ambulation/Gait Ambulation/Gait assistance: Min guard Ambulation Distance (Feet): 5 Feet Assistive device: Rolling walker (2 wheeled) Gait Pattern/deviations: Decreased step length - left;Decreased stance time - right;Decreased weight shift to right Gait velocity: Decreased Gait velocity interpretation: <1.8 ft/sec, indicative of risk for recurrent falls General Gait Details: Pt ambulates from bed to recliner with short, shuffling steps. Heavy UE reliance on rolling walker. Pt educated about proper sequencing with walker during forward and retro ambulation  Stairs            Wheelchair Mobility    Modified Rankin (Stroke Patients Only)       Balance Overall balance assessment: Needs assistance Sitting-balance support: No upper extremity supported Sitting balance-Leahy Scale: Good     Standing balance support: Bilateral upper extremity supported Standing balance-Leahy Scale: Fair Standing balance comment: Able to remove hands from walker with decreased weight shift to RLE                             Pertinent Vitals/Pain Pain Assessment: 0-10 Pain Score: 7  Pain Location: R knee Pain Descriptors / Indicators: Operative site guarding Pain Intervention(s): Monitored during session;Premedicated before session    Home Living Family/patient expects to be discharged to:: Private residence Living Arrangements: Spouse/significant other Available Help at Discharge: Family Type of Home: House Home Access: Level entry     Home Layout: Two level;Bed/bath upstairs Home Equipment: Cane - single point;Toilet riser (no walker, has a  reacher and sock puller, no shower chair)      Prior Function Level of Independence: Independent               Hand Dominance   Dominant Hand: Right    Extremity/Trunk Assessment   Upper Extremity  Assessment Upper Extremity Assessment: Overall WFL for tasks assessed    Lower Extremity Assessment Lower Extremity Assessment: RLE deficits/detail RLE Deficits / Details: LLE strength grossly WFL. Full DF/PF RLE, Able to perform SLR without assist after a couple assisted repetitions. Reports intact sensation to light touch bilaterally.        Communication   Communication: No difficulties  Cognition Arousal/Alertness: Awake/alert Behavior During Therapy: WFL for tasks assessed/performed Overall Cognitive Status: Within Functional Limits for tasks assessed                                        General Comments      Exercises Total Joint Exercises Ankle Circles/Pumps: AROM;Both;10 reps;Supine Quad Sets: Strengthening;Both;10 reps;Supine Gluteal Sets: Strengthening;Both;10 reps;Supine Towel Squeeze: Strengthening;Both;10 reps;Supine Short Arc Quad: Strengthening;Right;10 reps;Supine Heel Slides: Strengthening;Right;10 reps;Supine Hip ABduction/ADduction: Strengthening;Right;10 reps;Supine Straight Leg Raises: Strengthening;Right;10 reps;Supine Goniometric ROM: Not obtained at this time secondary to pain with heel slides. Will measure POD#1   Assessment/Plan    PT Assessment Patient needs continued PT services  PT Problem List Decreased strength;Decreased range of motion;Decreased balance;Decreased mobility;Pain       PT Treatment Interventions DME instruction;Gait training;Functional mobility training;Therapeutic activities;Stair training;Balance training;Therapeutic exercise;Neuromuscular re-education;Patient/family education;Manual techniques    PT Goals (Current goals can be found in the Care Plan section)  Acute Rehab PT Goals Patient Stated Goal: Return to prior function at home with less pain PT Goal Formulation: With patient/family Time For Goal Achievement: 07/13/16 Potential to Achieve Goals: Good    Frequency BID   Barriers to discharge  Inaccessible home environment 14 stairs to get to bedroom/bathroom in 2 level townhome    Co-evaluation               End of Session Equipment Utilized During Treatment: Gait belt Activity Tolerance: Patient tolerated treatment well Patient left: in chair;with call bell/phone within reach;with chair alarm set;with SCD's reapplied;Other (comment) (Towel roll under heel, polar care in place) Nurse Communication: Mobility status PT Visit Diagnosis: Muscle weakness (generalized) (M62.81);Difficulty in walking, not elsewhere classified (R26.2)    Time: 0347-4259 PT Time Calculation (min) (ACUTE ONLY): 37 min   Charges:   PT Evaluation $PT Eval Low Complexity: 1 Procedure PT Treatments $Therapeutic Exercise: 8-22 mins   PT G Codes:        Lyndel Safe Huprich PT, DPT   Huprich,Jason 06/29/2016, 4:38 PM

## 2016-06-29 NOTE — Op Note (Signed)
06/29/2016  10:00 AM  PATIENT:  Eric Russell  61 y.o. male  PRE-OPERATIVE DIAGNOSIS:  PRIMARY OSTEOARTHRITIS OF RIGHT KNEE  POST-OPERATIVE DIAGNOSIS:  PRIMARY OSTEOARTHRITIS OF RIGHT KNEE  PROCEDURE:  Procedure(s): TOTAL KNEE ARTHROPLASTY (Right)  SURGEON: Laurene Footman, MD  ASSISTANTS: [facility]  ANESTHESIA:   spinal  EBL:  Total I/O In: 1800 [I.V.:1800] Out: 750 [Urine:500; Blood:250]  BLOOD ADMINISTERED:none  DRAINS: Incisional wound VAC   LOCAL MEDICATIONS USED:  MARCAINE    and OTHER Exparel and morphine  SPECIMEN:  No Specimen  DISPOSITION OF SPECIMEN:  N/A  COUNTS:  YES  TOURNIQUET:    IMPLANTS: Medacta GMK sphere 5+ femur right, 5 tibia with short stem and 10 mm insert, 3 patella, all components cemented  DICTATION: .Dragon Dictation patient brought the operating room and after adequate spinal anesthesia was obtained the right leg was prepped and draped in sterile fashion was turned by the upper thigh. After patient identification and timeout procedure completed a midline skin incision was made followed by medial parapatellar arthrotomy was a large prepatellar bursa with a large amount of fluid within this was debrided of*the case. Final medial parapatellar arthrotomy the inspection knee revealed tricompartmental severe Oster arthritis with significant bone loss medial compartment and exposed bone and bone loss of the patella. Lateral compartment was also severely arthritic without bone loss. Anterior cruciate ligament PCL and fat pad were incised at this time. The proximal tibia was exposed and a proximal tibia cutting guide applied for the [place] my knee system. Proximal tibia cut carried out followed by distal femoral cut and 4-in-1 cut for the distal femur after excising residual posterior horns of menisci a size 5 tibia plate was placed and preparation with proximal drilling And keel punched performed the 5 femur notch cut was made and the 5+ femoral  trial was placed the 10 mm insert giving good stability and full extension and flexion. These trial components were all removed and the patella cut was a first a rongeur to remove some loose fragments from the lateral aspect and noting significant eburnation of bone loss of the lateral facet. But the patella was measured and resected carried out measured approximately 24 mm in thickness and was cut down to 13 with drill holes made it sized to size 3. At this point tourniquet was raised after the local injection of noted above with the bony surfaces thoroughly irrigated and dried the tibial component was cemented into place first followed by the plastic insert with set screw using the torque screwdriver the femoral component was then cemented in place and held in extension as the patellar button was clamped as well with an a back cement being utilized. After the cemented all set the excess cement was removed using a small osteotome and the knee thoroughly irrigated. Tourniquet was let down lateral release performed because of some tightness to the lateral retinaculum and patella tracked well with no touch technique. The arthrotomy was repaired using a heavy Quill, 3-0V lock subcutaneous suture, skin staples and we will incisional wound VAC applied. 2 Ace wrap for then applied as well secondary to significant peripheral edema there was pre-existing. At the close the case patient center comes stable condition  PLAN OF CARE: Admit to inpatient   PATIENT DISPOSITION:  PACU - hemodynamically stable.

## 2016-06-29 NOTE — Progress Notes (Signed)
Pt remains oob to recliner, eating dinner. Dr. Rudene Christians has rounded on pt, wife at bedside. Pt with no complaints. ivf continues, foley with nearly continual output of clear yellow urine.

## 2016-06-29 NOTE — Anesthesia Procedure Notes (Signed)
Spinal  Patient location during procedure: OR Staffing Anesthesiologist: Gunnar Fusi Resident/CRNA: Rolla Plate Performed: resident/CRNA  Preanesthetic Checklist Completed: patient identified, site marked, surgical consent, pre-op evaluation, timeout performed, IV checked, risks and benefits discussed and monitors and equipment checked Spinal Block Patient position: sitting Prep: ChloraPrep and site prepped and draped Patient monitoring: heart rate, continuous pulse ox, blood pressure and cardiac monitor Approach: midline Location: L4-5 Injection technique: single-shot Needle Needle type: Whitacre and Introducer  Needle gauge: 25 G Needle length: 12.7 cm Additional Notes Negative paresthesia. Negative blood return. Positive free-flowing CSF. Expiration date of kit checked and confirmed. Patient tolerated procedure well, without complications.

## 2016-06-30 LAB — BASIC METABOLIC PANEL
Anion gap: 7 (ref 5–15)
BUN: 21 mg/dL — ABNORMAL HIGH (ref 6–20)
CO2: 28 mmol/L (ref 22–32)
CREATININE: 1.3 mg/dL — AB (ref 0.61–1.24)
Calcium: 8.1 mg/dL — ABNORMAL LOW (ref 8.9–10.3)
Chloride: 105 mmol/L (ref 101–111)
GFR calc Af Amer: >60 mL/min (ref >60)
GFR, EST NON AFRICAN AMERICAN: 58 mL/min — AB (ref >60)
Glucose, Bld: 139 mg/dL — ABNORMAL HIGH (ref 65–99)
Potassium: 2.7 mmol/L — CL (ref 3.5–5.1)
Sodium: 140 mmol/L (ref 135–145)

## 2016-06-30 LAB — CBC
HCT: 31 % — ABNORMAL LOW (ref 40.0–52.0)
Hemoglobin: 10.8 g/dL — ABNORMAL LOW (ref 13.0–18.0)
MCH: 31 pg (ref 26.0–34.0)
MCHC: 34.8 g/dL (ref 32.0–36.0)
MCV: 89.1 fL (ref 80.0–100.0)
PLATELETS: 221 10*3/uL (ref 150–440)
RBC: 3.49 MIL/uL — AB (ref 4.40–5.90)
RDW: 14.1 % (ref 11.5–14.5)
WBC: 11.1 10*3/uL — ABNORMAL HIGH (ref 3.8–10.6)

## 2016-06-30 LAB — POTASSIUM: POTASSIUM: 3.4 mmol/L — AB (ref 3.5–5.1)

## 2016-06-30 MED ORDER — FE FUMARATE-B12-VIT C-FA-IFC PO CAPS
1.0000 | ORAL_CAPSULE | Freq: Three times a day (TID) | ORAL | Status: DC
  Administered 2016-06-30 – 2016-07-01 (×5): 1 via ORAL
  Filled 2016-06-30 (×5): qty 1

## 2016-06-30 MED ORDER — POTASSIUM CHLORIDE CRYS ER 20 MEQ PO TBCR
40.0000 meq | EXTENDED_RELEASE_TABLET | Freq: Two times a day (BID) | ORAL | Status: DC
  Administered 2016-06-30 – 2016-07-01 (×2): 40 meq via ORAL
  Filled 2016-06-30 (×3): qty 2

## 2016-06-30 MED ORDER — POTASSIUM CHLORIDE 20 MEQ PO PACK
40.0000 meq | PACK | Freq: Three times a day (TID) | ORAL | Status: DC
  Administered 2016-06-30: 40 meq via ORAL
  Filled 2016-06-30: qty 2

## 2016-06-30 MED ORDER — TRIAMTERENE-HCTZ 37.5-25 MG PO TABS
1.0000 | ORAL_TABLET | Freq: Every day | ORAL | Status: DC
  Administered 2016-07-01: 1 via ORAL

## 2016-06-30 NOTE — Progress Notes (Signed)
qPhysical Therapy Treatment Patient Details Name: Eric Russell MRN: [id number] DOB: [birth year] Today's Date: 06/30/2016    History of Present Illness Pt underwent R TKR without some post-op nausea and vomiting. PMHx includes arthritis, prostate cancer, HTN, and sleep apnea. Pt is POD 1 this date.    PT Comments    Pt is making good progress towards goals with improved ambulation distance this session. Pt able to follow commands for improved fluid gait sequencing. Good endurance with there-ex and reviewed written HEP, addressed all questions. Pt remains motivated to continue PT, will continue to progress.   Follow Up Recommendations  Home health PT;Supervision - Intermittent     Equipment Recommendations  Rolling walker with 5" wheels;3in1 (PT)    Recommendations for Other Services OT consult     Precautions / Restrictions Precautions Precautions: Knee Precaution Booklet Issued: Yes (comment) Required Braces or Orthoses: Knee Immobilizer - Right Knee Immobilizer - Right: Discontinue once straight leg raise with < 10 degree lag Restrictions Weight Bearing Restrictions: Yes RLE Weight Bearing: Weight bearing as tolerated    Mobility  Bed Mobility Overal bed mobility: Needs Assistance Bed Mobility: Sit to Supine       Sit to supine: Min assist   General bed mobility comments: assist for sliding surgical leg back onto bed. Min assist also required for positioning in bed. Pt able to follow commands well.  Transfers Overall transfer level: Needs assistance Equipment used: Rolling walker (2 wheeled) Transfers: Sit to/from Stand Sit to Stand: Min guard         General transfer comment: Pt able to push from seated surface and stand with RW with improved ease this session. Once standing, able to stand with upright posture  Ambulation/Gait Ambulation/Gait assistance: Min guard Ambulation Distance (Feet): 150 Feet Assistive device: Rolling walker (2 wheeled) Gait  Pattern/deviations: Decreased step length - left;Decreased stance time - right;Decreased weight shift to right Gait velocity: Decreased   General Gait Details: Pt ambulated in hallway able to progress to reciprocal gait pattern with cues. Pt needs tactile cues for quad activation during stance phase and to increased R stance time. Pt also needs cues to look forward during ambulation.   Stairs            Wheelchair Mobility    Modified Rankin (Stroke Patients Only)       Balance                                            Cognition Arousal/Alertness: Awake/alert Behavior During Therapy: WFL for tasks assessed/performed Overall Cognitive Status: Within Functional Limits for tasks assessed                                        Exercises Other Exercises Other Exercises: Seated ther-ex performed including ankle pumps, quad sets, glut sets, SLRs, SAQ, hip abd/add, and LAQ. ALl ther-ex performed x 12 reps on R LE with min assist. Safe technique performed Other Exercises: Pt able to ambulate to bathroom with cga and supervision for hygiene.    General Comments        Pertinent Vitals/Pain Pain Assessment: 0-10 Pain Score: 4  Pain Location: R knee Pain Descriptors / Indicators: Operative site guarding;Aching Pain Intervention(s): Limited activity within patient's tolerance    Home Living  Prior Function            PT Goals (current goals can now be found in the care plan section) Acute Rehab PT Goals Patient Stated Goal: have less pain PT Goal Formulation: With patient/family Time For Goal Achievement: 07/13/16 Potential to Achieve Goals: Good Progress towards PT goals: Progressing toward goals    Frequency    BID      PT Plan Current plan remains appropriate    Co-evaluation             End of Session Equipment Utilized During Treatment: Gait belt Activity Tolerance: Patient  tolerated treatment well Patient left: in bed;with bed alarm set;with family/visitor present Nurse Communication: Mobility status PT Visit Diagnosis: Muscle weakness (generalized) (M62.81);Difficulty in walking, not elsewhere classified (R26.2)     Time: 6283-6629 PT Time Calculation (min) (ACUTE ONLY): 40 min  Charges:  $Gait Training: 23-37 mins $Therapeutic Exercise: 8-22 mins                    G Codes:       Eric Russell, PT, DPT [phone]    Eric Russell 06/30/2016, 5:10 PM

## 2016-06-30 NOTE — Anesthesia Postprocedure Evaluation (Signed)
Anesthesia Post Note  Patient: MARSELINO SLAYTON  Procedure(s) Performed: Procedure(s) (LRB): TOTAL KNEE ARTHROPLASTY (Right)  Patient location during evaluation: Nursing Unit Anesthesia Type: Spinal Level of consciousness: awake, awake and alert and oriented Pain management: pain level controlled Vital Signs Assessment: post-procedure vital signs reviewed and stable Respiratory status: spontaneous breathing, nonlabored ventilation and respiratory function stable Cardiovascular status: stable Postop Assessment: no headache and no backache Anesthetic complications: no     Last Vitals:  Vitals:   06/29/16 2317 06/30/16 0410  BP: (!) 184/77 (!) 160/80  Pulse: 80 75  Resp: 18   Temp: 37.6 C 36.9 C    Last Pain:  Vitals:   06/30/16 0615  TempSrc:   PainSc: 5                  Kyro Joswick,  Charyl Minervini R

## 2016-06-30 NOTE — Progress Notes (Signed)
qPhysical Therapy Treatment Patient Details Name: Eric Russell MRN: [id number] DOB: 1955-09-21 Today's Date: 06/30/2016    History of Present Illness Pt underwent R TKR without some post-op nausea and vomiting. PMHx includes arthritis, prostate cancer, HTN, and sleep apnea. Pt is POD 1 this date.    PT Comments    Pt is making good progress towards goals with improved progress noted with ambulation this date. Pt able to demonstrate heel strike during stepping, however limited to step to gait pattern. Cues for upright posture. Good progress with AAROM this date. Pt appears limited by pain, but continues to be motivated to perform therapy. Will continue to progress. Will need to perform stair training prior to dc home, has 7 stairs followed by landing, then another 7 stairs. Steps have alternating railings.   Follow Up Recommendations  Home health PT;Supervision - Intermittent     Equipment Recommendations  Rolling walker with 5" wheels;3in1 (PT)    Recommendations for Other Services OT consult     Precautions / Restrictions Precautions Precautions: Knee Precaution Booklet Issued: Yes (comment) Required Braces or Orthoses: Knee Immobilizer - Right Knee Immobilizer - Right: Discontinue once straight leg raise with < 10 degree lag Restrictions Weight Bearing Restrictions: Yes RLE Weight Bearing: Weight bearing as tolerated    Mobility  Bed Mobility Overal bed mobility: Needs Assistance Bed Mobility: Supine to Sit     Supine to sit: Min assist     General bed mobility comments: not performed as pt received in recliner  Transfers Overall transfer level: Needs assistance Equipment used: Rolling walker (2 wheeled) Transfers: Sit to/from Stand Sit to Stand: Min assist         General transfer comment: cues given for position of feet prior to standing as well as cues to push from seated surface. Once standing, pt able to maintain balance and upright  posture  Ambulation/Gait Ambulation/Gait assistance: Min guard Ambulation Distance (Feet): 75 Feet Assistive device: Rolling walker (2 wheeled) Gait Pattern/deviations: Decreased step length - left;Decreased stance time - right;Decreased weight shift to right     General Gait Details: Pt ambulates with slow step to gait pattern. Cued for upright posture and to look forward while taking steps. Needs cues for sequencing as he still places heavy B UE weight through RW. No LOB or dizziness noted with ambulation   Stairs            Wheelchair Mobility    Modified Rankin (Stroke Patients Only)       Balance Overall balance assessment: Needs assistance Sitting-balance support: No upper extremity supported Sitting balance-Leahy Scale: Good     Standing balance support: Bilateral upper extremity supported Standing balance-Leahy Scale: Fair                              Cognition Arousal/Alertness: Awake/alert Behavior During Therapy: WFL for tasks assessed/performed Overall Cognitive Status: Within Functional Limits for tasks assessed                                 General Comments: able to follow 1-2 step cues inconsistently requiring additional verbal cues for hand placement during transfers to maximize safety      Exercises Total Joint Exercises Goniometric ROM: R knee AAROM: 14-85 degrees Other Exercises Other Exercises: Pt/spouse educated in functional mobility training with RW to negotiate kitchen space including environmental modifications/set up and  body positioning during functional tasks; pt/spouse verbalized understanding Other Exercises: Seated ther-ex performed including ankle pumps, quad sets, glut sets, SLRs, SAQ, hip abd/add, and seated knee flexion stretches. ALl ther-ex performed x 10-12 reps on R LE with min assist. Safe technique performed    General Comments        Pertinent Vitals/Pain Pain Assessment: 0-10 Pain Score: 6   Pain Location: R knee Pain Descriptors / Indicators: Operative site guarding;Aching Pain Intervention(s): Limited activity within patient's tolerance;RN gave pain meds during session;Ice applied    Home Living Family/patient expects to be discharged to:: Private residence Living Arrangements: Spouse/significant other Available Help at Discharge: Family;Available 24 hours/day;Available PRN/intermittently (wife available 24/7 until 4/2, then other family available PRN) Type of Home: House (townhouse) Home Access: Level entry   Home Layout: Two level;Bed/bath upstairs;1/2 bath on main level Home Equipment: Cane - single point;Toilet riser;Adaptive equipment      Prior Function Level of Independence: Independent with assistive device(s)      Comments: Pt reports using SPC for ambulation recently; using AE for LB ADL and requiring assist from wife for socks/shoes due to pain; wife performs all household tasks; pt was driving less recently due to RLE pain, hasn't been able to work recently due to pain (stands for 8+hours a day) but plans to return to work once better; enjoys playing with grandkids, walking dogs several times/day   PT Goals (current goals can now be found in the care plan section) Acute Rehab PT Goals Patient Stated Goal: have less pain PT Goal Formulation: With patient/family Time For Goal Achievement: 07/13/16 Potential to Achieve Goals: Good Progress towards PT goals: Progressing toward goals    Frequency    BID      PT Plan Current plan remains appropriate    Co-evaluation             End of Session Equipment Utilized During Treatment: Gait belt Activity Tolerance: Patient tolerated treatment well Patient left: in chair;with call bell/phone within reach;with chair alarm set;with SCD's reapplied;Other (comment) Nurse Communication: Mobility status PT Visit Diagnosis: Muscle weakness (generalized) (M62.81);Difficulty in walking, not elsewhere classified  (R26.2)     Time: 1610-9604 PT Time Calculation (min) (ACUTE ONLY): 39 min  Charges:  $Gait Training: 23-37 mins $Therapeutic Exercise: 8-22 mins                    G Codes:       Eric Russell, PT, DPT [phone]    Eric Russell 06/30/2016, 10:46 AM

## 2016-06-30 NOTE — Evaluation (Signed)
Occupational Therapy Evaluation Patient Details Name: Eric Russell MRN: [id number] DOB: [birth year] Today's Date: 06/30/2016    History of Present Illness Pt underwent R TKR without some post-op nausea and vomiting. PMHx includes arthritis, prostate cancer, HTN, and sleep apnea. Pt is POD#1 at time of initial OT evaluation.    Clinical Impression   Pt seen for OT evaluation this date, POD#1 s/p R TKR. Pt was modified independent prior to surgery with increasing difficulty with LB ADL due to pain, requiring assist from wife for socks/shoes and unable to work. Pt is eager to return to PLOF with less pain in order to return to driving and working in addition to walking his dogs and playing with his grandchildren. Pt presents with 5/10 pain in R knee, decreased strength/ROM in R knee, decreased activity tolerance/safety awareness, increased need for assist for self care tasks, and increased risk of falls. Pt will benefit from skilled OT services to address noted impairments and functional deficits including education/training in AE for LB ADL, energy conservation strategies, home/routines modifications, and work simplification strategies in order to maximize return to PLOF and minimize risk of falls. Pt would benefit from [facility] services and supervision once medically appropriate for discharge and pending pt's ability to safely negotiate stairs with PT prior to discharge.    Follow Up Recommendations  Home health OT;Supervision - Intermittent    Equipment Recommendations  3 in 1 bedside commode    Recommendations for Other Services       Precautions / Restrictions Precautions Precautions: Knee Required Braces or Orthoses: Knee Immobilizer - Right Knee Immobilizer - Right: Discontinue once straight leg raise with < 10 degree lag Restrictions Weight Bearing Restrictions: No RLE Weight Bearing: Weight bearing as tolerated      Mobility Bed Mobility Overal bed mobility: Needs Assistance Bed  Mobility: Supine to Sit     Supine to sit: Min assist     General bed mobility comments: Pt requires UE support and Min A for RLE mgt to come upright to sitting EOB. Denies dizziness or nausea with position changes.  Transfers Overall transfer level: Needs assistance Equipment used: Rolling walker (2 wheeled) Transfers: Sit to/from Stand Sit to Stand: Min assist         General transfer comment: Pt requires minA+1 due to weakness in order to come upright to standing. Moderate cues required for safe hand placement during transfers. Once upright pt is steady and stable in standing with bilateral UE support on walker    Balance Overall balance assessment: Needs assistance Sitting-balance support: No upper extremity supported Sitting balance-Leahy Scale: Good     Standing balance support: Bilateral upper extremity supported Standing balance-Leahy Scale: Fair                             ADL either performed or assessed with clinical judgement   ADL Overall ADL's : Needs assistance/impaired Eating/Feeding: Sitting;Set up   Grooming: Sitting;Set up   Upper Body Bathing: Sitting;Set up;With caregiver independent assisting   Lower Body Bathing: Sitting/lateral leans;Minimal assistance;With caregiver independent assisting   Upper Body Dressing : Sitting;Set up;Supervision/safety   Lower Body Dressing: Moderate assistance;Sitting/lateral leans;Sit to/from stand;Cueing for safety     Toilet Transfer Details (indicate cue type and reason): deferred due to safety/fatigue         Functional mobility during ADLs: Min guard;Cueing for safety;Cueing for sequencing;Rolling walker General ADL Comments: Pt generally min-mod A for LB ADL, cueing  for safety/sequencing; would benefit from education/training in AE for LB ADL     Vision Baseline Vision/History: Wears glasses Wears Glasses: At all times Patient Visual Report: No change from baseline Vision Assessment?: No  apparent visual deficits     Perception     Praxis Praxis Praxis tested?: Within functional limits    Pertinent Vitals/Pain Pain Assessment: 0-10 Pain Score: 5  Pain Location: R knee Pain Descriptors / Indicators: Operative site guarding;Aching Pain Intervention(s): Limited activity within patient's tolerance;Monitored during session;Premedicated before session;Ice applied;Repositioned     Hand Dominance Right   Extremity/Trunk Assessment Upper Extremity Assessment Upper Extremity Assessment: Overall WFL for tasks assessed   Lower Extremity Assessment Lower Extremity Assessment: Defer to PT evaluation;RLE deficits/detail   Cervical / Trunk Assessment Cervical / Trunk Assessment: Normal   Communication Communication Communication: No difficulties   Cognition Arousal/Alertness: Awake/alert (some grogginess, reports he didn't sleep much last night) Behavior During Therapy: WFL for tasks assessed/performed Overall Cognitive Status: Within Functional Limits for tasks assessed                                 General Comments: able to follow 1-2 step cues inconsistently requiring additional verbal cues for hand placement during transfers to maximize safety   General Comments       Exercises Other Exercises Other Exercises: Pt/spouse educated in functional mobility training with RW to negotiate kitchen space including environmental modifications/set up and body positioning during functional tasks; pt/spouse verbalized understanding   Shoulder Instructions      Home Living Family/patient expects to be discharged to:: Private residence Living Arrangements: Spouse/significant other Available Help at Discharge: Family;Available 24 hours/day;Available PRN/intermittently (wife available 24/7 until 4/2, then other family available PRN) Type of Home: House (townhouse) Home Access: Level entry     Home Layout: Two level;Bed/bath upstairs;1/2 bath on main  level Alternate Level Stairs-Number of Steps: 14 (7, landing, 7) w/ handrail on 1 side Alternate Level Stairs-Rails: Left;Right (L up first 7, R up 2nd 7) Bathroom Shower/Tub: Tub/shower unit (garden tub)   Bathroom Toilet: Standard (with toilet riser)     Home Equipment: Empire - single point;Toilet riser;Adaptive equipment Adaptive Equipment: Sock aid;Reacher        Prior Functioning/Environment Level of Independence: Independent with assistive device(s)        Comments: Pt reports using SPC for ambulation recently; using AE for LB ADL and requiring assist from wife for socks/shoes due to pain; wife performs all household tasks; pt was driving less recently due to RLE pain, hasn't been able to work recently due to pain (stands for 8+hours a day) but plans to return to work once better; enjoys playing with grandkids, walking dogs several times/day        OT Problem List: Decreased strength;Pain;Decreased range of motion;Decreased coordination;Decreased safety awareness;Decreased activity tolerance;Decreased knowledge of use of DME or AE      OT Treatment/Interventions: Self-care/ADL training;Energy conservation;DME and/or AE instruction;Patient/family education    OT Goals(Current goals can be found in the care plan section) Acute Rehab OT Goals Patient Stated Goal: have less pain OT Goal Formulation: With patient/family Time For Goal Achievement: 07/14/16 Potential to Achieve Goals: Good  OT Frequency: Min 2X/week   Barriers to D/C: Inaccessible home environment  14 steps to 2nd floor where bed/master bath is located       Co-evaluation  End of Session Equipment Utilized During Treatment: Gait belt;Rolling walker Nurse Communication: Other (comment) (notified RN of need for [facility] in room)  Activity Tolerance: Patient limited by fatigue Patient left: in chair;with call bell/phone within reach;with chair alarm set;with nursing/sitter in room;with  family/visitor present;Other (comment) (polar care and bone foam in place)  OT Visit Diagnosis: Other abnormalities of gait and mobility (R26.89);Muscle weakness (generalized) (M62.81);Pain Pain - Right/Left: Right Pain - part of body: Knee                Time: 5694-3700 OT Time Calculation (min): 40 min Charges:  OT General Charges $OT Visit: 1 Procedure OT Evaluation $OT Eval Low Complexity: 1 Procedure OT Treatments $Self Care/Home Management : 23-37 mins G-Codes:     Jeni Salles, MPH, MS, OTR/L ascom [phone] 06/30/16, 9:51 AM

## 2016-06-30 NOTE — Progress Notes (Signed)
   Subjective: 1 Day Post-Op Procedure(s) (LRB): TOTAL KNEE ARTHROPLASTY (Right) Patient reports pain as mild.   Patient is well, but has had some minor complaints of nausea that has improved. Denies any CP, SOB, ABD pain. We will continue therapy today.  Plan is to go Home after hospital stay.  Objective: Vital signs in last 24 hours: Temp:  [97.3 F (36.3 C)-99.6 F (37.6 C)] 98.4 F (36.9 C) (03/28 0410) Pulse Rate:  [51-89] 75 (03/28 0410) Resp:  [10-22] 18 (03/27 2317) BP: (128-195)/(74-95) 160/80 (03/28 0410) SpO2:  [93 %-100 %] 95 % (03/28 0410)  Intake/Output from previous day: 03/27 0701 - 03/28 0700 In: 3665 [P.O.:480; I.V.:3135; IV Piggyback:50] Out: 0998 [Urine:4450; Emesis/NG output:2; Blood:250] Intake/Output this shift: No intake/output data recorded.   Recent Labs  06/29/16 0618 06/29/16 1337 06/30/16 0424  HGB 11.2* 12.1* 10.8*    Recent Labs  06/29/16 1337 06/30/16 0424  WBC 15.4* 11.1*  RBC 3.87* 3.49*  HCT 34.8* 31.0*  PLT 223 221    Recent Labs  06/29/16 0618 06/29/16 1337 06/30/16 0424  NA 141  --  140  K 3.6  --  2.7*  CL  --   --  105  CO2  --   --  28  BUN  --   --  21*  CREATININE  --  1.38* 1.30*  GLUCOSE 154*  --  139*  CALCIUM  --   --  8.1*   No results for input(s): LABPT, INR in the last 72 hours.  EXAM General - Patient is Alert, Appropriate and Oriented Extremity - Sensation intact distally Intact pulses distally Dorsiflexion/Plantar flexion intact No cellulitis present Compartment soft Dressing - dressing C/D/I, no drainage and wound vac intact, no drainage Motor Function - intact, moving foot and toes well on exam.   Past Medical History:  Diagnosis Date  . Arthritis   . Cancer [facility])    prostate  . Hypertension   . Sleep apnea     Assessment/Plan:   1 Day Post-Op Procedure(s) (LRB): TOTAL KNEE ARTHROPLASTY (Right) Active Problems:   Primary localized osteoarthritis of right knee Acute post op  blood loss anemia  Hypokalemia  Estimated body mass index is 35.44 kg/m as calculated from the following:   Height as of this encounter: 5\' 10"  (1.778 m).   Weight as of this encounter: 112 kg (247 lb). Advance diet Up with therapy  Needs BM Acute post op blood loss anemia - start Iron supplement Hypokalemia - start [place] kon Recheck K this afternoon Recheck CBC/BMP tomorrow am CM to assist with discharge  DVT Prophylaxis - Lovenox, Foot Pumps and TED hose Weight-Bearing as tolerated to right leg   T. Rachelle Hora, PA-C [place] 06/30/2016, 7:26 AM

## 2016-06-30 NOTE — Care Management Note (Signed)
Case Management Note  Patient Details  Name: Eric Russell MRN: [id number] Date of Birth: [birth year]  Subjective/Objective:   POD # 1 right TKA. Met with patient and is wife at bedside to discuss discharge planning. It is anticipated patient will discharge home Friday with his wife. He will need a walker and BSC. Ordered from [facility] with [facility]. Offered choice of home health. Referral to [facility] for HHPT. [facility]  [phone]. Called Lovenox 40 mg # 14 no refills.  PCP is Emily Filbert.             Action/Plan:   Expected Discharge Date:                  Expected Discharge Plan:  [place]  In-House Referral:     Discharge planning Services  CM Consult  Post Acute Care Choice:  Durable Medical Equipment, Home Health Choice offered to:  Patient, Spouse  DME Arranged:  Bedside commode, Walker rolling DME Agency:  [place]:  PT [place]:  [place]  Status of Service:  In process, will continue to follow  If discussed at Long Length of Stay Meetings, dates discussed:    Additional Comments:  Jolly Mango, RN 06/30/2016, 3:30 PM

## 2016-06-30 NOTE — Progress Notes (Signed)
Clinical Social Worker (CSW) received SNF consult. PT is recommending home health. RN case manager aware of above. Please reconsult if future social work needs arise. CSW signing off.   Ashlee Bewley, LCSW [phone] 

## 2016-07-01 LAB — BASIC METABOLIC PANEL
ANION GAP: 6 (ref 5–15)
BUN: 30 mg/dL — AB (ref 6–20)
CO2: 30 mmol/L (ref 22–32)
Calcium: 8.2 mg/dL — ABNORMAL LOW (ref 8.9–10.3)
Chloride: 99 mmol/L — ABNORMAL LOW (ref 101–111)
Creatinine, Ser: 1.56 mg/dL — ABNORMAL HIGH (ref 0.61–1.24)
GFR calc Af Amer: 54 mL/min — ABNORMAL LOW (ref >60)
GFR calc non Af Amer: 46 mL/min — ABNORMAL LOW (ref >60)
GLUCOSE: 114 mg/dL — AB (ref 65–99)
POTASSIUM: 3.4 mmol/L — AB (ref 3.5–5.1)
Sodium: 135 mmol/L (ref 135–145)

## 2016-07-01 LAB — CBC
HEMATOCRIT: 29.7 % — AB (ref 40.0–52.0)
Hemoglobin: 10.5 g/dL — ABNORMAL LOW (ref 13.0–18.0)
MCH: 31.5 pg (ref 26.0–34.0)
MCHC: 35.3 g/dL (ref 32.0–36.0)
MCV: 89.3 fL (ref 80.0–100.0)
Platelets: 205 10*3/uL (ref 150–440)
RBC: 3.33 MIL/uL — AB (ref 4.40–5.90)
RDW: 13.8 % (ref 11.5–14.5)
WBC: 11.9 10*3/uL — AB (ref 3.8–10.6)

## 2016-07-01 MED ORDER — ENOXAPARIN SODIUM 30 MG/0.3ML ~~LOC~~ SOLN: 30.0000 mg | SUBCUTANEOUS | 0 refills | Status: DC

## 2016-07-01 MED ORDER — OXYCODONE HCL 5 MG PO TABS: 5.0000 mg | ORAL_TABLET | ORAL | 0 refills | Status: DC | PRN

## 2016-07-01 MED ORDER — TRAMADOL HCL 50 MG PO TABS: 50.0000 mg | ORAL_TABLET | Freq: Four times a day (QID) | ORAL | 0 refills | Status: DC | PRN

## 2016-07-01 MED ORDER — POTASSIUM CHLORIDE CRYS ER 20 MEQ PO TBCR: 20.0000 meq | EXTENDED_RELEASE_TABLET | Freq: Two times a day (BID) | ORAL | Status: DC

## 2016-07-01 MED ORDER — DOCUSATE SODIUM 100 MG PO CAPS: 100.0000 mg | ORAL_CAPSULE | Freq: Two times a day (BID) | ORAL | 0 refills | Status: DC

## 2016-07-01 NOTE — Care Management Note (Addendum)
Case Management Note  Patient Details  Name: Eric Russell MRN: [id number] Date of Birth: 11/10/1955  Subjective/Objective:   Discharging today.                  Action/Plan: Notified [facility] of discharge. DME delivered  Expected Discharge Date:  07/01/16               Expected Discharge Plan:  [place]  In-House Referral:     Discharge planning Services  CM Consult  Post Acute Care Choice:  Durable Medical Equipment, Home Health Choice offered to:  Patient, Spouse  DME Arranged:  Bedside commode, Walker rolling DME Agency:  [place]:  PT [place] Agency:  [facility]  Status of Service:  Completed, signed off  If discussed at [employer] of Stay Meetings, dates discussed:    Additional Comments:  Jolly Mango, RN 07/01/2016, 2:28 PM

## 2016-07-01 NOTE — Progress Notes (Signed)
qPhysical Therapy Treatment Patient Details Name: Eric Russell MRN: [id number] DOB: 09-13-1955 Today's Date: 07/01/2016    History of Present Illness Pt underwent R TKR without some post-op nausea and vomiting. PMHx includes arthritis, prostate cancer, HTN, and sleep apnea.     PT Comments    Pt is making good progress towards goals with improved AAROM on R knee along with increased ambulation distance using RW. Pt responds well to verbal cues for gait training. Pt able to demonstrate reciprocal gait pattern this date with improved consistency. Good endurance with there-ex this date. Will continue to progress.  Follow Up Recommendations  Home health PT;Supervision - Intermittent     Equipment Recommendations  Rolling walker with 5" wheels;3in1 (PT)    Recommendations for Other Services       Precautions / Restrictions Precautions Precautions: Knee Precaution Booklet Issued: Yes (comment) Required Braces or Orthoses: Knee Immobilizer - Right Knee Immobilizer - Right: Discontinue once straight leg raise with < 10 degree lag Restrictions Weight Bearing Restrictions: Yes RLE Weight Bearing: Weight bearing as tolerated    Mobility  Bed Mobility Overal bed mobility: Needs Assistance Bed Mobility: Supine to Sit     Supine to sit: Min guard     General bed mobility comments: assist for guiding R LE to floor. Pt needs cues to push down into bed with hands in order to scoot out towards EOB  Transfers Overall transfer level: Needs assistance Equipment used: Rolling walker (2 wheeled) Transfers: Sit to/from Stand Sit to Stand: Min guard         General transfer comment: Safe technique performed with RW and pushing from seated surface. Increased difficulty rising from lower surface  Ambulation/Gait Ambulation/Gait assistance: Min guard Ambulation Distance (Feet): 200 Feet Assistive device: Rolling walker (2 wheeled) Gait Pattern/deviations: Step-through  pattern;Decreased step length - right;Decreased step length - left;Decreased stance time - right Gait velocity: performed 10' walk in 18 sec   General Gait Details: Pt ambulated around RN station with reciprocal gait pattern and cues for looking forward. Pt still demonstrates slow gait speed and decreased stance time on R leg. Cued for increased swing phase and landing with heel.    Stairs            Wheelchair Mobility    Modified Rankin (Stroke Patients Only)       Balance                                            Cognition Arousal/Alertness: Awake/alert Behavior During Therapy: WFL for tasks assessed/performed Overall Cognitive Status: Within Functional Limits for tasks assessed                                        Exercises Total Joint Exercises Goniometric ROM: R knee AAROM: 10-90 degrees Other Exercises Other Exercises: Supine ther-ex performed including ankle pumps, quad sets, glut sets, SLRs, SAQ, hip abd/add, and seated knee flexion stretches. ALl ther-ex performed x 15 reps on R LE with min assist. Safe technique performed    General Comments        Pertinent Vitals/Pain Pain Assessment: 0-10 Pain Score: 6  Pain Location: R knee Pain Descriptors / Indicators: Operative site guarding;Aching Pain Intervention(s): Limited activity within patient's tolerance    Home Living  Prior Function            PT Goals (current goals can now be found in the care plan section) Acute Rehab PT Goals Patient Stated Goal: have less pain PT Goal Formulation: With patient/family Time For Goal Achievement: 07/13/16 Potential to Achieve Goals: Good Progress towards PT goals: Progressing toward goals    Frequency    BID      PT Plan Current plan remains appropriate    Co-evaluation             End of Session Equipment Utilized During Treatment: Gait belt Activity Tolerance: Patient  tolerated treatment well Patient left: in chair;with chair alarm set Nurse Communication: Mobility status PT Visit Diagnosis: Muscle weakness (generalized) (M62.81);Difficulty in walking, not elsewhere classified (R26.2)     Time: 7564-3329 PT Time Calculation (min) (ACUTE ONLY): 40 min  Charges:  $Gait Training: 23-37 mins $Therapeutic Exercise: 8-22 mins                    G Codes:       Greggory Stallion, PT, DPT [phone]    Shamica Moree 07/01/2016, 10:03 AM

## 2016-07-01 NOTE — Progress Notes (Signed)
qPhysical Therapy Treatment Patient Details Name: NATE COMMON MRN: [id number] DOB: 30-Nov-1955 Today's Date: 07/01/2016    History of Present Illness Pt underwent R TKR without some post-op nausea and vomiting. PMHx includes arthritis, prostate cancer, HTN, and sleep apnea.     PT Comments    Pt is making good progress and has met goals for discharge this date. Pt has performed stair training with safe technique simulating home environment. Pt took extended time for practice and demonstration prior to performance. Also spent extensive time answering all questions and reviewed HEP.    Follow Up Recommendations  Home health PT;Supervision - Intermittent     Equipment Recommendations  Rolling walker with 5" wheels;3in1 (PT)    Recommendations for Other Services       Precautions / Restrictions Precautions Precautions: Knee Precaution Booklet Issued: Yes (comment) Required Braces or Orthoses: Knee Immobilizer - Right Knee Immobilizer - Right: Discontinue once straight leg raise with < 10 degree lag Restrictions Weight Bearing Restrictions: Yes RLE Weight Bearing: Weight bearing as tolerated    Mobility  Bed Mobility Overal bed mobility: Needs Assistance Bed Mobility: Sit to Supine       Sit to supine: Min guard   General bed mobility comments: assist for guiding R LE back up onto bed. Used trapeze bar to slide up towards [place].  Transfers Overall transfer level: Needs assistance Equipment used: Rolling walker (2 wheeled) Transfers: Sit to/from Stand Sit to Stand: Supervision         General transfer comment: Safe technique performed with RW and pushing from seated surface. Increased difficulty rising from lower surface  Ambulation/Gait Ambulation/Gait assistance: Min guard Ambulation Distance (Feet): 10 Feet Assistive device: Rolling walker (2 wheeled) Gait Pattern/deviations: Step-through pattern;Decreased step length - right;Decreased step length -  left;Decreased stance time - right     General Gait Details: Pt ambulated in room towards WC with reciprocal gait pattern. Safe technique performed with limited cues provided by therapist.    Stairs Stairs: Yes   Stair Management: Two rails;Step to pattern Number of Stairs: 4 General stair comments: Pt performed stair training x 3 reps, first with B rails, followed by 2 attempts with 1 rail and 1 SPC. Rails alternate at home and this was practiced. Safe technique with slow step to gait pattern with cues to activate quad during stance phase. +2 used for safety. Pt fatigues post stair performance  Wheelchair Mobility    Modified Rankin (Stroke Patients Only)       Balance                                            Cognition Arousal/Alertness: Awake/alert Behavior During Therapy: WFL for tasks assessed/performed Overall Cognitive Status: Within Functional Limits for tasks assessed                                        Exercises Other Exercises Other Exercises: Pt/spouse educated in DME/AE/home mods to support safety in the home, including anti-slip tape for tub, tub rail, placement of shower chair in tub, tub transfer and car transfer techniques, grab bar placement, and using a urinal overnight to minimize falls risk and using BSC over commode to improve toilet transfers and safety; both verbalized understanding, notes provided on equipment recommendations Other Exercises:  Pt able to ambulate to bathroom with cga and supervision for hygiene.    General Comments        Pertinent Vitals/Pain Pain Assessment: 0-10 Pain Score: 3  Pain Location: R knee Pain Descriptors / Indicators: Operative site guarding;Aching Pain Intervention(s): Limited activity within patient's tolerance;Ice applied    Home Living                      Prior Function            PT Goals (current goals can now be found in the care plan section) Acute Rehab  PT Goals Patient Stated Goal: have less pain PT Goal Formulation: With patient/family Time For Goal Achievement: 07/13/16 Potential to Achieve Goals: Good Progress towards PT goals: Progressing toward goals    Frequency    BID      PT Plan Current plan remains appropriate    Co-evaluation             End of Session Equipment Utilized During Treatment: Gait belt Activity Tolerance: Patient tolerated treatment well Patient left: in bed;with nursing/sitter in room (OT entering room) Nurse Communication: Mobility status PT Visit Diagnosis: Muscle weakness (generalized) (M62.81);Difficulty in walking, not elsewhere classified (R26.2)     Time: 0076-2263 PT Time Calculation (min) (ACUTE ONLY): 60 min  Charges:  $Gait Training: 23-37 mins $Therapeutic Activity: 23-37 mins                    G Codes:       Greggory Stallion, PT, DPT [phone]    Dahl Higinbotham 07/01/2016, 4:26 PM

## 2016-07-01 NOTE — Discharge Instructions (Signed)

## 2016-07-01 NOTE — Progress Notes (Signed)
   Subjective: 2 Days Post-Op Procedure(s) (LRB): TOTAL KNEE ARTHROPLASTY (Right) Patient reports pain as mild.   Patient is well, and has had no acute complaints or problems Denies any CP, SOB, ABD pain. We will continue therapy today.  Plan is to go Home after hospital stay.  Objective: Vital signs in last 24 hours: Temp:  [97.6 F (36.4 C)-97.7 F (36.5 C)] 97.7 F (36.5 C) (03/28 2336) Pulse Rate:  [66-70] 70 (03/28 2336) Resp:  [18-20] 20 (03/28 2336) BP: (156-162)/(62-76) 156/76 (03/28 2336) SpO2:  [92 %-98 %] 94 % (03/28 2336)  Intake/Output from previous day: 03/28 0701 - 03/29 0700 In: 880 [P.O.:720; I.V.:160] Out: -  Intake/Output this shift: No intake/output data recorded.   Recent Labs  06/29/16 0618 06/29/16 1337 06/30/16 0424 07/01/16 0603  HGB 11.2* 12.1* 10.8* 10.5*    Recent Labs  06/30/16 0424 07/01/16 0603  WBC 11.1* 11.9*  RBC 3.49* 3.33*  HCT 31.0* 29.7*  PLT 221 205    Recent Labs  06/30/16 0424 06/30/16 1507 07/01/16 0603  NA 140  --  135  K 2.7* 3.4* 3.4*  CL 105  --  99*  CO2 28  --  30  BUN 21*  --  30*  CREATININE 1.30*  --  1.56*  GLUCOSE 139*  --  114*  CALCIUM 8.1*  --  8.2*   No results for input(s): LABPT, INR in the last 72 hours.  EXAM General - Patient is Alert, Appropriate and Oriented Extremity - Sensation intact distally Intact pulses distally Dorsiflexion/Plantar flexion intact No cellulitis present Compartment soft Dressing - dressing C/D/I, no drainage and wound vac intact, no drainage Motor Function - intact, moving foot and toes well on exam.   Past Medical History:  Diagnosis Date  . Arthritis   . Cancer [facility])    prostate  . Hypertension   . Sleep apnea     Assessment/Plan:   2 Days Post-Op Procedure(s) (LRB): TOTAL KNEE ARTHROPLASTY (Right) Active Problems:   Primary localized osteoarthritis of right knee Acute post op blood loss anemia  Hypokalemia  Estimated body mass index is  35.44 kg/m as calculated from the following:   Height as of this encounter: 5\' 10"  (1.778 m).   Weight as of this encounter: 112 kg (247 lb). Advance diet Up with therapy  Needs BM Acute post op blood loss anemia - stable, continue Iron supplement Hypokalemia - Trending up, continue with oral K today Recheck CBC/BMP tomorrow am Discharge home with HHPT tomorrow.  DVT Prophylaxis - Lovenox, Foot Pumps and TED hose Weight-Bearing as tolerated to right leg   T. Rachelle Hora, PA-C [place] 07/01/2016, 8:41 AM

## 2016-07-01 NOTE — Care Management (Signed)
[facility] Patient has no insurance card on file. Updated wife. She will update information with pharmacy.

## 2016-07-01 NOTE — Progress Notes (Signed)
Patient is being discharged home with wife. IV removed with cath intact. Reviewed discharge instructions. Wife took scripts to fill. Patient belongings packed. Waiting for wife to pick up patient at this time.

## 2016-07-01 NOTE — Discharge Summary (Signed)
Physician Discharge Summary  Patient ID: Eric Russell MRN: [id number] DOB/AGE: 1955-04-15 61 y.o.  Admit date: 06/29/2016 Discharge date: 07/01/2016  Admission Diagnoses:  Primary localized osteoarthritis of right knee [M17.11]   Discharge Diagnoses: Patient Active Problem List   Diagnosis Date Noted  . Primary localized osteoarthritis of right knee 06/29/2016    Past Medical History:  Diagnosis Date  . Arthritis   . Cancer [facility])    prostate  . Hypertension   . Sleep apnea      Transfusion: none   Consultants (if any):   Discharged Condition: Improved  Hospital Course: Eric Russell is an 61 y.o. male who was admitted 06/29/2016 with a diagnosis of Right knee osteoarthritis and went to the operating room on 06/29/2016 and underwent the above named procedures.    Surgeries: Procedure(s): TOTAL KNEE ARTHROPLASTY on 06/29/2016 Patient tolerated the surgery well. Taken to PACU where she was stabilized and then transferred to the orthopedic floor.  Started on Lovenox 30 q 12 hrs. Foot pumps applied bilaterally at 80 mm. Heels elevated on bed with rolled towels. No evidence of DVT. Negative [place]. Physical therapy started on day #1 for gait training and transfer. OT started day #1 for ADL and assisted devices.  Patient's foley was d/c on day #1. On postop day 1 patient had acute postop blood loss anemia along with hypokalemia. Patient was started on potassium supplements and iron supplements. On postop day 2, potassium and hemoglobin improved and stable. Patient's IV was d/c on day #2.  On post op day #3 patient was stable and ready for discharge to home with home health physical therapy  Implants: Medacta GMK sphere 5+ femur right, 5 tibia with short stem and 10 mm insert, 3 patella, all components cemented  He was given perioperative antibiotics:  Anti-infectives    Start     Dose/Rate Route Frequency Ordered Stop   06/29/16 1200  ceFAZolin (ANCEF) IVPB 2 g/50 mL premix      2 g 100 mL/hr over 30 Minutes Intravenous Every 6 hours 06/29/16 1125 06/29/16 2339   06/29/16 1115  ceFAZolin (ANCEF) IVPB 2g/100 mL premix  Status:  Discontinued     2 g 200 mL/hr over 30 Minutes Intravenous Every 6 hours 06/29/16 1114 06/29/16 1125   06/29/16 0545  ceFAZolin (ANCEF) 2-4 GM/100ML-% IVPB    Comments:  Phillips Grout: cabinet override      06/29/16 0545 06/29/16 0738   06/29/16 0045  ceFAZolin (ANCEF) IVPB 2g/100 mL premix     2 g 200 mL/hr over 30 Minutes Intravenous  Once 06/29/16 0032 06/29/16 0753    .  He was given sequential compression devices, early ambulation, and Lovenox for DVT prophylaxis.  He benefited maximally from the hospital stay and there were no complications.    Recent vital signs:  Vitals:   06/30/16 1606 06/30/16 2336  BP: (!) 159/71 (!) 156/76  Pulse: 70 70  Resp: 18 20  Temp: 97.6 F (36.4 C) 97.7 F (36.5 C)    Recent laboratory studies:  Lab Results  Component Value Date   HGB 10.5 (L) 07/01/2016   HGB 10.8 (L) 06/30/2016   HGB 12.1 (L) 06/29/2016   Lab Results  Component Value Date   WBC 11.9 (H) 07/01/2016   PLT 205 07/01/2016   Lab Results  Component Value Date   INR 0.97 06/23/2016   Lab Results  Component Value Date   NA 135 07/01/2016   K 3.4 (L) 07/01/2016  CL 99 (L) 07/01/2016   CO2 30 07/01/2016   BUN 30 (H) 07/01/2016   CREATININE 1.56 (H) 07/01/2016   GLUCOSE 114 (H) 07/01/2016    Discharge Medications:   Allergies as of 07/01/2016   No Known Allergies     Medication List    TAKE these medications   acetaminophen 650 MG CR tablet Commonly known as:  TYLENOL Take 1,300 mg by mouth 2 (two) times daily.   amLODipine 10 MG tablet Commonly known as:  NORVASC Take 5 mg by mouth 2 (two) times daily.   carvedilol 25 MG tablet Commonly known as:  COREG Take 50 mg by mouth 2 (two) times daily with a meal.   docusate sodium 100 MG capsule Commonly known as:  COLACE Take 1 capsule (100 mg total)  by mouth 2 (two) times daily.   enoxaparin 30 MG/0.3ML injection Commonly known as:  LOVENOX Inject 0.3 mLs (30 mg total) into the skin daily.   furosemide 20 MG tablet Commonly known as:  LASIX Take 10 mg by mouth daily.   hydrALAZINE 50 MG tablet Commonly known as:  APRESOLINE Take 75 mg by mouth 2 (two) times daily.   lisinopril 20 MG tablet Commonly known as:  PRINIVIL,ZESTRIL Take 20 mg by mouth daily.   metFORMIN 500 MG tablet Commonly known as:  GLUCOPHAGE Take 1,000 mg by mouth every evening.   multivitamin with minerals tablet Take 1 tablet by mouth daily.   oxyCODONE 5 MG immediate release tablet Commonly known as:  Oxy IR/ROXICODONE Take 1-2 tablets (5-10 mg total) by mouth every 3 (three) hours as needed for breakthrough pain.   PRESCRIPTION MEDICATION Pt uses CPAP Machine   sodium chloride 0.65 % Soln nasal spray Commonly known as:  OCEAN Place 1 spray into both nostrils at bedtime as needed for congestion.   traMADol 50 MG tablet Commonly known as:  ULTRAM Take 1-2 tablets (50-100 mg total) by mouth every 6 (six) hours as needed for moderate pain.   triamterene-hydrochlorothiazide 75-50 MG tablet Commonly known as:  MAXZIDE Take 1 tablet by mouth daily.   Turmeric 500 MG Caps Take 2 capsules by mouth daily.            Durable Medical Equipment        Start     Ordered   06/29/16 1115  DME Walker rolling  Once    Question:  Patient needs a walker to treat with the following condition  Answer:  Status post total knee replacement   06/29/16 1114   06/29/16 1115  DME 3 n 1  Once     06/29/16 1114   06/29/16 1115  DME Bedside commode  Once    Question:  Patient needs a bedside commode to treat with the following condition  Answer:  Status post total knee replacement   06/29/16 1114      Diagnostic Studies: Dg Knee 1-2 Views Right  Result Date: 06/29/2016 CLINICAL DATA:  Knee replacement . EXAM: RIGHT KNEE - 1-2 VIEW COMPARISON:  CT  05/20/2016. FINDINGS: Total knee replacement. Hardware intact. Anatomic alignment. No acute bony abnormality identified. IMPRESSION: Total right knee replacement with good anatomic alignment. Electronically Signed   By: Marcello Moores  Register   On: 06/29/2016 10:54    Disposition: Final discharge disposition not confirmed       Signed: Dorise Hiss CHRISTOPHER 07/01/2016, 8:49 AM

## 2016-07-01 NOTE — Progress Notes (Signed)
Occupational Therapy Treatment Patient Details Name: Eric Russell MRN: [id number] DOB: 10-Jan-1956 Today's Date: 07/01/2016    History of present illness Pt underwent R TKR without some post-op nausea and vomiting. PMHx includes arthritis, prostate cancer, HTN, and sleep apnea.    OT comments  Pt seen for OT treatment this date focused on pt/spouse education/training in home mods and AE/DME to maximize safety and functional independence in the home. Notes provided on equipment recommendations, pt and spouse verbalized understanding of education provided. Continue to progress towards goals.   Follow Up Recommendations  Home health OT;Supervision - Intermittent    Equipment Recommendations  3 in 1 bedside commode;Other (comment) (anti-slip tape for tub, tub rail, grab bars in bathroom, handheld shower head)    Recommendations for Other Services      Precautions / Restrictions Precautions Precautions: Knee Required Braces or Orthoses: Knee Immobilizer - Right Knee Immobilizer - Right: Discontinue once straight leg raise with < 10 degree lag Restrictions Weight Bearing Restrictions: Yes RLE Weight Bearing: Weight bearing as tolerated       Mobility Bed Mobility                  Transfers                      Balance                                           ADL either performed or assessed with clinical judgement   ADL Overall ADL's : Needs assistance/impaired                                       General ADL Comments: Spouse verbalizes confidence that she can provide needed level of assistanec for Pt at home upon discharge.      Vision Baseline Vision/History: Wears glasses Wears Glasses: At all times Patient Visual Report: No change from baseline Vision Assessment?: No apparent visual deficits   Perception     Praxis Praxis Praxis tested?: Within functional limits    Cognition Arousal/Alertness:  Awake/alert Behavior During Therapy: WFL for tasks assessed/performed Overall Cognitive Status: Within Functional Limits for tasks assessed                                          Exercises Other Exercises Other Exercises: Pt/spouse educated in DME/AE/home mods to support safety in the home, including anti-slip tape for tub, tub rail, placement of shower chair in tub, tub transfer and car transfer techniques, grab bar placement, and using a urinal overnight to minimize falls risk and using BSC over commode to improve toilet transfers and safety; both verbalized understanding, notes provided on equipment recommendations   Shoulder Instructions       General Comments      Pertinent Vitals/ Pain       Pain Assessment: 0-10 Pain Score: 6  Pain Location: R knee Pain Descriptors / Indicators: Operative site guarding;Aching Pain Intervention(s): Limited activity within patient's tolerance  Home Living  Prior Functioning/Environment              Frequency  Min 2X/week        Progress Toward Goals  OT Goals(current goals can now be found in the care plan section)  Progress towards OT goals: Progressing toward goals  Acute Rehab OT Goals Patient Stated Goal: have less pain OT Goal Formulation: With patient Time For Goal Achievement: 07/14/16 Potential to Achieve Goals: Good  Plan Discharge plan remains appropriate;Frequency remains appropriate    Co-evaluation                 End of Session    OT Visit Diagnosis: Other abnormalities of gait and mobility (R26.89);Muscle weakness (generalized) (M62.81);Pain Pain - Right/Left: Right Pain - part of body: Knee   Activity Tolerance Patient tolerated treatment well   Patient Left in bed;with call bell/phone within reach;with family/visitor present;Other (comment) (polar care in place)   Nurse Communication          Time: 1155-2080 OT  Time Calculation (min): 23 min  Charges: OT General Charges $OT Visit: 1 Procedure OT Treatments $Self Care/Home Management : 23-37 mins  Jeni Salles, MPH, MS, OTR/L ascom [phone] 07/01/16, 2:46 PM

## 2016-09-15 ENCOUNTER — Other Ambulatory Visit: Payer: Self-pay | Admitting: Orthopedic Surgery

## 2016-09-15 ENCOUNTER — Ambulatory Visit
Admission: RE | Admit: 2016-09-15 | Discharge: 2016-09-15 | Disposition: A | Discharge status: Home / Self Care | Payer: BLUE CROSS/BLUE SHIELD | Source: Ambulatory Visit | Attending: Orthopedic Surgery | Admitting: Orthopedic Surgery

## 2016-09-15 DIAGNOSIS — M7989 Other specified soft tissue disorders: Secondary | ICD-10-CM

## 2017-09-28 DIAGNOSIS — Z96651 Presence of right artificial knee joint: Secondary | ICD-10-CM | POA: Insufficient documentation

## 2017-09-29 ENCOUNTER — Other Ambulatory Visit: Payer: Self-pay | Admitting: Internal Medicine

## 2017-09-29 DIAGNOSIS — G459 Transient cerebral ischemic attack, unspecified: Secondary | ICD-10-CM

## 2017-10-12 ENCOUNTER — Ambulatory Visit: Payer: BLUE CROSS/BLUE SHIELD

## 2017-10-12 ENCOUNTER — Other Ambulatory Visit: Payer: BLUE CROSS/BLUE SHIELD

## 2017-10-19 ENCOUNTER — Ambulatory Visit
Admission: RE | Admit: 2017-10-19 | Discharge: 2017-10-19 | Disposition: A | Discharge status: Home / Self Care | Payer: BLUE CROSS/BLUE SHIELD | Source: Ambulatory Visit | Attending: Internal Medicine | Admitting: Internal Medicine

## 2017-10-19 DIAGNOSIS — G459 Transient cerebral ischemic attack, unspecified: Secondary | ICD-10-CM | POA: Insufficient documentation

## 2017-10-21 ENCOUNTER — Other Ambulatory Visit: Payer: Self-pay | Admitting: Internal Medicine

## 2017-10-21 DIAGNOSIS — N289 Disorder of kidney and ureter, unspecified: Secondary | ICD-10-CM

## 2017-10-27 ENCOUNTER — Ambulatory Visit: Payer: BLUE CROSS/BLUE SHIELD

## 2017-11-01 ENCOUNTER — Ambulatory Visit
Admission: RE | Admit: 2017-11-01 | Discharge: 2017-11-01 | Disposition: A | Discharge status: Home / Self Care | Payer: BLUE CROSS/BLUE SHIELD | Source: Ambulatory Visit | Attending: Internal Medicine | Admitting: Internal Medicine

## 2017-11-01 DIAGNOSIS — N2889 Other specified disorders of kidney and ureter: Secondary | ICD-10-CM | POA: Insufficient documentation

## 2017-11-01 DIAGNOSIS — N289 Disorder of kidney and ureter, unspecified: Secondary | ICD-10-CM | POA: Diagnosis present

## 2017-11-01 DIAGNOSIS — N281 Cyst of kidney, acquired: Secondary | ICD-10-CM | POA: Insufficient documentation

## 2017-12-16 DIAGNOSIS — E669 Obesity, unspecified: Secondary | ICD-10-CM | POA: Insufficient documentation

## 2017-12-16 DIAGNOSIS — E1122 Type 2 diabetes mellitus with diabetic chronic kidney disease: Secondary | ICD-10-CM | POA: Insufficient documentation

## 2017-12-16 DIAGNOSIS — D631 Anemia in chronic kidney disease: Secondary | ICD-10-CM | POA: Insufficient documentation

## 2017-12-16 DIAGNOSIS — E559 Vitamin D deficiency, unspecified: Secondary | ICD-10-CM | POA: Insufficient documentation

## 2017-12-16 DIAGNOSIS — E876 Hypokalemia: Secondary | ICD-10-CM | POA: Insufficient documentation

## 2017-12-16 DIAGNOSIS — I10 Essential (primary) hypertension: Secondary | ICD-10-CM | POA: Insufficient documentation

## 2018-09-19 ENCOUNTER — Encounter: Payer: Self-pay | Admitting: Anesthesiology

## 2018-09-19 ENCOUNTER — Emergency Department: Payer: Worker's Compensation

## 2018-09-19 ENCOUNTER — Inpatient Hospital Stay
Admission: EM | Admit: 2018-09-19 | Discharge: 2018-09-23 | DRG: 470 | Disposition: A | Discharge status: Home Health | Payer: Worker's Compensation | Attending: Internal Medicine | Admitting: Internal Medicine

## 2018-09-19 ENCOUNTER — Other Ambulatory Visit: Payer: Self-pay

## 2018-09-19 DIAGNOSIS — Z96651 Presence of right artificial knee joint: Secondary | ICD-10-CM | POA: Diagnosis present

## 2018-09-19 DIAGNOSIS — S72002A Fracture of unspecified part of neck of left femur, initial encounter for closed fracture: Secondary | ICD-10-CM | POA: Diagnosis present

## 2018-09-19 DIAGNOSIS — E785 Hyperlipidemia, unspecified: Secondary | ICD-10-CM | POA: Diagnosis present

## 2018-09-19 DIAGNOSIS — N184 Chronic kidney disease, stage 4 (severe): Secondary | ICD-10-CM | POA: Diagnosis present

## 2018-09-19 DIAGNOSIS — Y990 Civilian activity done for income or pay: Secondary | ICD-10-CM | POA: Diagnosis not present

## 2018-09-19 DIAGNOSIS — E1122 Type 2 diabetes mellitus with diabetic chronic kidney disease: Secondary | ICD-10-CM | POA: Diagnosis present

## 2018-09-19 DIAGNOSIS — W010XXA Fall on same level from slipping, tripping and stumbling without subsequent striking against object, initial encounter: Secondary | ICD-10-CM | POA: Diagnosis present

## 2018-09-19 DIAGNOSIS — G4733 Obstructive sleep apnea (adult) (pediatric): Secondary | ICD-10-CM | POA: Diagnosis present

## 2018-09-19 DIAGNOSIS — I129 Hypertensive chronic kidney disease with stage 1 through stage 4 chronic kidney disease, or unspecified chronic kidney disease: Secondary | ICD-10-CM | POA: Diagnosis present

## 2018-09-19 DIAGNOSIS — D72829 Elevated white blood cell count, unspecified: Secondary | ICD-10-CM | POA: Diagnosis present

## 2018-09-19 DIAGNOSIS — Z419 Encounter for procedure for purposes other than remedying health state, unspecified: Secondary | ICD-10-CM

## 2018-09-19 DIAGNOSIS — Z20828 Contact with and (suspected) exposure to other viral communicable diseases: Secondary | ICD-10-CM | POA: Diagnosis present

## 2018-09-19 DIAGNOSIS — S72009A Fracture of unspecified part of neck of unspecified femur, initial encounter for closed fracture: Secondary | ICD-10-CM | POA: Diagnosis present

## 2018-09-19 DIAGNOSIS — Z01818 Encounter for other preprocedural examination: Secondary | ICD-10-CM

## 2018-09-19 DIAGNOSIS — G8918 Other acute postprocedural pain: Secondary | ICD-10-CM

## 2018-09-19 DIAGNOSIS — Z79899 Other long term (current) drug therapy: Secondary | ICD-10-CM

## 2018-09-19 DIAGNOSIS — Z9049 Acquired absence of other specified parts of digestive tract: Secondary | ICD-10-CM | POA: Diagnosis not present

## 2018-09-19 DIAGNOSIS — Z79891 Long term (current) use of opiate analgesic: Secondary | ICD-10-CM

## 2018-09-19 DIAGNOSIS — D62 Acute posthemorrhagic anemia: Secondary | ICD-10-CM | POA: Diagnosis not present

## 2018-09-19 DIAGNOSIS — Z7984 Long term (current) use of oral hypoglycemic drugs: Secondary | ICD-10-CM | POA: Diagnosis not present

## 2018-09-19 HISTORY — DX: Type 2 diabetes mellitus without complications: E11.9

## 2018-09-19 LAB — CBC
HCT: 31.1 % — ABNORMAL LOW (ref 39.0–52.0)
Hemoglobin: 10.6 g/dL — ABNORMAL LOW (ref 13.0–17.0)
MCH: 29.8 pg (ref 26.0–34.0)
MCHC: 34.1 g/dL (ref 30.0–36.0)
MCV: 87.4 fL (ref 80.0–100.0)
Platelets: 225 K/uL (ref 150–400)
RBC: 3.56 MIL/uL — ABNORMAL LOW (ref 4.22–5.81)
RDW: 13.1 % (ref 11.5–15.5)
WBC: 15.7 K/uL — ABNORMAL HIGH (ref 4.0–10.5)
nRBC: 0 % (ref 0.0–0.2)

## 2018-09-19 LAB — GLUCOSE, CAPILLARY
Glucose-Capillary: 107 mg/dL — ABNORMAL HIGH (ref 70–99)
Glucose-Capillary: 174 mg/dL — ABNORMAL HIGH (ref 70–99)

## 2018-09-19 LAB — PROTIME-INR
INR: 1.1 (ref 0.8–1.2)
Prothrombin Time: 14.1 s (ref 11.4–15.2)

## 2018-09-19 LAB — SARS CORONAVIRUS 2 BY RT PCR (HOSPITAL ORDER, PERFORMED IN ~~LOC~~ HOSPITAL LAB): SARS Coronavirus 2: NEGATIVE

## 2018-09-19 LAB — BASIC METABOLIC PANEL
Anion gap: 14 (ref 5–15)
BUN: 52 mg/dL — ABNORMAL HIGH (ref 8–23)
CO2: 21 mmol/L — ABNORMAL LOW (ref 22–32)
Calcium: 9.2 mg/dL (ref 8.9–10.3)
Chloride: 105 mmol/L (ref 98–111)
Creatinine, Ser: 2.45 mg/dL — ABNORMAL HIGH (ref 0.61–1.24)
GFR calc Af Amer: 31 mL/min — ABNORMAL LOW (ref >60)
GFR calc non Af Amer: 27 mL/min — ABNORMAL LOW (ref >60)
Glucose, Bld: 128 mg/dL — ABNORMAL HIGH (ref 70–99)
Potassium: 3.7 mmol/L (ref 3.5–5.1)
Sodium: 140 mmol/L (ref 135–145)

## 2018-09-19 LAB — SURGICAL PCR SCREEN
MRSA, PCR: NEGATIVE
Staphylococcus aureus: NEGATIVE

## 2018-09-19 MED ORDER — CARVEDILOL 25 MG PO TABS
50.0000 mg | ORAL_TABLET | Freq: Two times a day (BID) | ORAL | Status: DC
  Administered 2018-09-20 – 2018-09-23 (×7): 50 mg via ORAL
  Filled 2018-09-19 (×7): qty 2

## 2018-09-19 MED ORDER — HYDROCODONE-ACETAMINOPHEN 5-325 MG PO TABS
1.0000 | ORAL_TABLET | Freq: Four times a day (QID) | ORAL | Status: DC | PRN
  Administered 2018-09-19 – 2018-09-21 (×3): 2 via ORAL
  Filled 2018-09-19 (×3): qty 2

## 2018-09-19 MED ORDER — HYDRALAZINE HCL 50 MG PO TABS
50.0000 mg | ORAL_TABLET | Freq: Two times a day (BID) | ORAL | Status: DC
  Administered 2018-09-19 – 2018-09-23 (×8): 50 mg via ORAL
  Filled 2018-09-19 (×9): qty 1

## 2018-09-19 MED ORDER — TRIAMTERENE-HCTZ 75-50 MG PO TABS
1.0000 | ORAL_TABLET | Freq: Every day | ORAL | Status: DC
  Filled 2018-09-19: qty 1

## 2018-09-19 MED ORDER — HYDROMORPHONE HCL 1 MG/ML IJ SOLN
0.5000 mg | INTRAMUSCULAR | Status: AC
  Filled 2018-09-19: qty 1

## 2018-09-19 MED ORDER — LISINOPRIL 20 MG PO TABS
20.0000 mg | ORAL_TABLET | Freq: Every day | ORAL | Status: DC
  Administered 2018-09-20 – 2018-09-23 (×4): 20 mg via ORAL
  Filled 2018-09-19 (×4): qty 1

## 2018-09-19 MED ORDER — MORPHINE SULFATE (PF) 2 MG/ML IV SOLN
0.5000 mg | INTRAVENOUS | Status: DC | PRN
  Administered 2018-09-20 – 2018-09-21 (×5): 0.5 mg via INTRAVENOUS
  Filled 2018-09-19 (×5): qty 1

## 2018-09-19 MED ORDER — FUROSEMIDE 20 MG PO TABS
10.0000 mg | ORAL_TABLET | Freq: Every day | ORAL | Status: DC
  Administered 2018-09-20 – 2018-09-23 (×4): 10 mg via ORAL
  Filled 2018-09-19 (×4): qty 1

## 2018-09-19 MED ORDER — MULTI-VITAMIN/MINERALS PO TABS: 1.0000 | ORAL_TABLET | Freq: Every day | ORAL | Status: DC

## 2018-09-19 MED ORDER — INSULIN ASPART 100 UNIT/ML ~~LOC~~ SOLN
0.0000 [IU] | Freq: Three times a day (TID) | SUBCUTANEOUS | Status: DC
  Administered 2018-09-23: 1 [IU] via SUBCUTANEOUS
  Filled 2018-09-19: qty 1

## 2018-09-19 MED ORDER — SODIUM CHLORIDE 0.9 % IV SOLN
INTRAVENOUS | Status: DC
  Administered 2018-09-19 – 2018-09-21 (×4): via INTRAVENOUS

## 2018-09-19 MED ORDER — INSULIN ASPART 100 UNIT/ML ~~LOC~~ SOLN: 0.0000 [IU] | Freq: Every day | SUBCUTANEOUS | Status: DC

## 2018-09-19 MED ORDER — HYDROMORPHONE HCL 1 MG/ML IJ SOLN
0.5000 mg | INTRAMUSCULAR | Status: AC
  Administered 2018-09-19: 0.5 mg via INTRAVENOUS
  Filled 2018-09-19: qty 1

## 2018-09-19 MED ORDER — ADULT MULTIVITAMIN W/MINERALS CH
1.0000 | ORAL_TABLET | Freq: Every day | ORAL | Status: DC
  Administered 2018-09-22 – 2018-09-23 (×2): 1 via ORAL
  Filled 2018-09-19 (×2): qty 1

## 2018-09-19 MED ORDER — SIMVASTATIN 20 MG PO TABS
10.0000 mg | ORAL_TABLET | Freq: Every day | ORAL | Status: DC
  Administered 2018-09-20 – 2018-09-22 (×3): 10 mg via ORAL
  Filled 2018-09-19 (×5): qty 1

## 2018-09-19 MED ORDER — CEFAZOLIN SODIUM-DEXTROSE 2-4 GM/100ML-% IV SOLN
2.0000 g | INTRAVENOUS | Status: DC
  Filled 2018-09-19: qty 100

## 2018-09-19 MED ORDER — MUPIROCIN 2 % EX OINT
1.0000 "application " | TOPICAL_OINTMENT | Freq: Two times a day (BID) | CUTANEOUS | Status: DC
  Administered 2018-09-20 – 2018-09-23 (×3): 1 via NASAL
  Filled 2018-09-19: qty 22

## 2018-09-19 MED ORDER — DOCUSATE SODIUM 100 MG PO CAPS
100.0000 mg | ORAL_CAPSULE | Freq: Two times a day (BID) | ORAL | Status: DC
  Administered 2018-09-20: 100 mg via ORAL
  Filled 2018-09-19 (×2): qty 1

## 2018-09-19 MED ORDER — POLYETHYLENE GLYCOL 3350 17 G PO PACK: 17.0000 g | PACK | Freq: Every day | ORAL | Status: DC | PRN

## 2018-09-19 MED ORDER — AMLODIPINE BESYLATE 10 MG PO TABS
10.0000 mg | ORAL_TABLET | Freq: Every day | ORAL | Status: DC
  Administered 2018-09-20 – 2018-09-23 (×4): 10 mg via ORAL
  Filled 2018-09-19 (×4): qty 1

## 2018-09-19 NOTE — Consult Note (Signed)
ORTHOPAEDIC CONSULTATION  REQUESTING PHYSICIAN: Fritzi Mandes, MD  Chief Complaint:   Left hip pain.  History of Present Illness: Eric Russell is a 63 y.o. male with a history of prostate cancer, diabetes, hypertension, and sleep apnea who was in his usual state of health and working at [employer] in [place] this afternoon when he apparently tripped over a box, causing him to fall hard on his left hip.  He was brought to the emergency room where x-rays demonstrated a displaced left femoral neck fracture.  The patient denies any associated injuries.  He did not strike his head or lose consciousness.  He also denies any lightheadedness, dizziness, chest pain, shortness of breath, or other symptoms which may have precipitated his fall.  Past Medical History:  Diagnosis Date  . Arthritis   . Cancer [facility])    prostate  . Diabetes mellitus without complication ([place])   . Hypertension   . Sleep apnea    Past Surgical History:  Procedure Laterality Date  . CHOLECYSTECTOMY    . MOHS SURGERY    . TOTAL KNEE ARTHROPLASTY Right 06/29/2016   Procedure: TOTAL KNEE ARTHROPLASTY;  Surgeon: Hessie Knows, MD;  Location: [facility] ORS;  Service: Orthopedics;  Laterality: Right;   Social History   Socioeconomic History  . Marital status: Married    Spouse name: Not on file  . Number of children: Not on file  . Years of education: Not on file  . Highest education level: Not on file  Occupational History  . Not on file  Social Needs  . Financial resource strain: Not on file  . Food insecurity    Worry: Not on file    Inability: Not on file  . Transportation needs    Medical: Not on file    Non-medical: Not on file  Tobacco Use  . Smoking status: Never Smoker  . Smokeless tobacco: Never Used  Substance and Sexual Activity  . Alcohol use: No  . Drug use: No  . Sexual activity: Not on file  Lifestyle  . Physical activity    Days per  week: Not on file    Minutes per session: Not on file  . Stress: Not on file  Relationships  . Social Herbalist on phone: Not on file    Gets together: Not on file    Attends religious service: Not on file    Active member of club or organization: Not on file    Attends meetings of clubs or organizations: Not on file    Relationship status: Not on file  Other Topics Concern  . Not on file  Social History Narrative  . Not on file   No family history on file. No Known Allergies Prior to Admission medications   Medication Sig Start Date End Date Taking? Authorizing Provider  acetaminophen (TYLENOL) 500 MG tablet Take 500-1,000 mg by mouth every 6 (six) hours as needed for mild pain, moderate pain or fever.   Yes [provider]  amLODipine (NORVASC) 10 MG tablet Take 10 mg by mouth daily.    Yes [provider]  carvedilol (COREG) 25 MG tablet Take 50 mg by mouth 2 (two) times daily with a meal.   Yes [provider]  furosemide (LASIX) 20 MG tablet Take 10 mg by mouth daily.   Yes [provider]  hydrALAZINE (APRESOLINE) 50 MG tablet Take 50 mg by mouth 2 (two) times daily.    Yes [provider]  lisinopril (PRINIVIL,ZESTRIL) 20 MG tablet Take 20 mg by mouth daily.   Yes [provider]  metFORMIN (GLUCOPHAGE) 500 MG tablet Take 500 mg by mouth 2 (two) times daily.    Yes [provider]  Multiple Vitamins-Minerals (MULTIVITAMIN WITH MINERALS) tablet Take 1 tablet by mouth daily.   Yes [provider]  simvastatin (ZOCOR) 10 MG tablet Take 10 mg by mouth at bedtime. 07/11/18  Yes [provider]  triamterene-hydrochlorothiazide (MAXZIDE) 75-50 MG tablet Take 1 tablet by mouth daily.   Yes [provider]   Dg Chest 1 View  Result Date: 09/19/2018 CLINICAL DATA:  Pain status post fall. EXAM: CHEST  1 VIEW COMPARISON:  Chest x-ray from 2016 could not be retrieved for comparison at the  time of this dictation. FINDINGS: The heart size is enlarged. Aortic calcifications are noted. There is no pneumothorax. No large pleural effusion. No focal infiltrate. IMPRESSION: No active disease. Electronically Signed   By: Constance Holster M.D.   On: 09/19/2018 14:45   Dg Finger Middle Right  Result Date: 09/19/2018 CLINICAL DATA:  Pain status post fall EXAM: RIGHT MIDDLE FINGER 2+V COMPARISON:  None. FINDINGS: There is a subtle lucency through the base of the proximal phalanx of the third digit. There are advanced degenerative changes of the distal interphalangeal joint of the third digit. There is soft tissue swelling. There is some age-indeterminate subluxation of the DIP. IMPRESSION: 1. Lucency through the base of the proximal phalanx of the third digit may represent a nondisplaced fracture. 2. Advanced degenerative changes of the DIP with some dorsal subluxation of the distal phalanx, which is age indeterminate but favored to be chronic and secondary to the advanced degenerative changes. 3. Soft tissue swelling about the third digit. Electronically Signed   By: Constance Holster M.D.   On: 09/19/2018 14:43   Dg Hip Unilat W Or Wo Pelvis 2-3 Views Left  Result Date: 09/19/2018 CLINICAL DATA:  Pain EXAM: DG HIP (WITH OR WITHOUT PELVIS) 2-3V LEFT COMPARISON:  None. FINDINGS: There is an acute displaced fracture of the left femoral neck. There is no dislocation. There are advanced degenerative changes of both hips. There is diffuse osteopenia. There is some regularity of the pubic rami on the left which may represent old healed fractures. IMPRESSION: Acute displaced fracture of the proximal left femur as above. Electronically Signed   By: Constance Holster M.D.   On: 09/19/2018 14:41    Positive ROS: All other systems have been reviewed and were otherwise negative with the exception of those mentioned in the HPI and as above.  Physical Exam: General:  Alert, no acute distress Psychiatric:   Patient is competent for consent with normal mood and affect   Cardiovascular:  No pedal edema Respiratory:  No wheezing, non-labored breathing GI:  Abdomen is soft and non-tender Skin:  No lesions in the area of chief complaint Neurologic:  Sensation intact distally Lymphatic:  No axillary or cervical lymphadenopathy  Orthopedic Exam:  Orthopedic examination is limited to the left hip and lower extremity.  The patient's left lower extremity is somewhat shortened and externally rotated as compared to the right.  Skin inspection around the left hip is unremarkable.  There is no swelling, erythema, ecchymosis, abrasions, or other skin normality is identified.  He has mild tenderness to palpation over the lateral aspect of the hip.  He has more severe pain with any attempted active or passive motion of the hip.  He is able to actively  dorsiflex and plantarflex his left ankle and toes.  Sensation is intact light touch to all distributions.  Has good capillary refill to his left foot.  X-rays:  X-rays of the pelvis and left hip are available for review and have been reviewed by myself.  These films demonstrate a varus displaced vertically oriented left femoral neck fracture.  No significant degenerative changes are noted.  No lytic lesions or other fractures are identified.  Assessment: Displaced left femoral neck fracture.  Plan: The treatment options have been discussed with the patient, including both surgical and nonsurgical choices.  Given the patient's age and level of activity, I feel that he would most benefit from a left total hip replacement rather than a left hip hemiarthroplasty.  This procedure has been discussed in detail with the patient, as have the potential risks (including bleeding, infection, nerve and/or blood vessel injury, persistent or recurrent pain, stiffness, loosening of and/or failure of the components, dislocation, leg length inequality, need for further surgery, blood  clots, strokes, heart attacks and/or arrhythmias, etc.) and benefits.  The patient states his understanding and wishes to proceed.  A formal written consent will be obtained by the nursing staff.  Thank you for asking me to participate in the care of this most pleasant young unfortunate man.  I will be happy to follow him with you.   Pascal Lux, MD  Beeper #:  [phone]  09/19/2018 7:49 PM

## 2018-09-19 NOTE — H&P (Signed)
[place] [facility] [place] NAME: Eric Russell    MR#:  [id number]  DATE OF BIRTH:  [birth year]  DATE OF ADMISSION:  09/19/2018  PRIMARY CARE PHYSICIAN: Rusty Aus, MD   REQUESTING/REFERRING PHYSICIAN: dr Jacqualine Code  CHIEF COMPLAINT:  left hip pain after fall at work. Patient works at [employer] ILLNESS:  Eric Russell  is a 63 y.o. male with a known history of hypertension, diabetes, hyperlipidemia and severe sleep apnea where CPAP comes to the emergency room after he fell tripped on a few boxes at [employer] where patient works.  In the ER patient was found to have acute left femoral neck fracture. He is being admitted for further evaluation management. Denies any dizziness head trauma chest pain shortness of breath. Patient denies any cardiac history  Dr. Roland Rack is aware of patient being admitted.  PAST MEDICAL HISTORY:   Past Medical History:  Diagnosis Date  . Arthritis   . Cancer [facility])    prostate  . Diabetes mellitus without complication ([place])   . Hypertension   . Sleep apnea     PAST SURGICAL HISTOIRY:   Past Surgical History:  Procedure Laterality Date  . CHOLECYSTECTOMY    . MOHS SURGERY    . TOTAL KNEE ARTHROPLASTY Right 06/29/2016   Procedure: TOTAL KNEE ARTHROPLASTY;  Surgeon: Hessie Knows, MD;  Location: [facility] ORS;  Service: Orthopedics;  Laterality: Right;    SOCIAL HISTORY:   Social History   Tobacco Use  . Smoking status: Never Smoker  . Smokeless tobacco: Never Used  Substance Use Topics  . Alcohol use: No    FAMILY HISTORY:  No family history on file.  DRUG ALLERGIES:  No Known Allergies  REVIEW OF SYSTEMS:  Review of Systems  Constitutional: Negative for chills, fever and weight loss.  HENT: Negative for ear discharge, ear pain and nosebleeds.   Eyes: Negative for blurred vision, pain and discharge.  Respiratory: Negative for sputum production, shortness of  breath, wheezing and stridor.   Cardiovascular: Negative for chest pain, palpitations, orthopnea and PND.  Gastrointestinal: Negative for abdominal pain, diarrhea, nausea and vomiting.  Genitourinary: Negative for frequency and urgency.  Musculoskeletal: Positive for joint pain. Negative for back pain.  Neurological: Negative for sensory change, speech change, focal weakness and weakness.  Psychiatric/Behavioral: Negative for depression and hallucinations. The patient is not nervous/anxious.      MEDICATIONS AT HOME:   Prior to Admission medications   Medication Sig Start Date End Date Taking? Authorizing Provider  acetaminophen (TYLENOL) 500 MG tablet Take 500-1,000 mg by mouth every 6 (six) hours as needed for mild pain, moderate pain or fever.   Yes [provider]  amLODipine (NORVASC) 10 MG tablet Take 10 mg by mouth daily.    Yes [provider]  carvedilol (COREG) 25 MG tablet Take 50 mg by mouth 2 (two) times daily with a meal.   Yes [provider]  furosemide (LASIX) 20 MG tablet Take 10 mg by mouth daily.   Yes [provider]  hydrALAZINE (APRESOLINE) 50 MG tablet Take 50 mg by mouth 2 (two) times daily.    Yes [provider]  lisinopril (PRINIVIL,ZESTRIL) 20 MG tablet Take 20 mg by mouth daily.   Yes [provider]  metFORMIN (GLUCOPHAGE) 500 MG tablet Take 500 mg by mouth 2 (two) times daily.    Yes [provider]  Multiple Vitamins-Minerals (MULTIVITAMIN WITH  MINERALS) tablet Take 1 tablet by mouth daily.   Yes [provider]  simvastatin (ZOCOR) 10 MG tablet Take 10 mg by mouth at bedtime. 07/11/18  Yes [provider]  triamterene-hydrochlorothiazide (MAXZIDE) 75-50 MG tablet Take 1 tablet by mouth daily.   Yes [provider]      VITAL SIGNS:  Blood pressure (!) 186/89, pulse 68, temperature 98.5 F (36.9 C), temperature source Oral, resp. rate 14, height 5\' 11"  (1.803 m), weight  106.6 kg, SpO2 97 %.  PHYSICAL EXAMINATION:  GENERAL:  63 y.o.-year-old patient lying in the bed with no acute distress.  EYES: Pupils equal, round, reactive to light and accommodation. No scleral icterus. Extraocular muscles intact.  HEENT: Head atraumatic, normocephalic. Oropharynx and nasopharynx clear.  NECK:  Supple, no jugular venous distention. No thyroid enlargement, no tenderness.  LUNGS: Normal breath sounds bilaterally, no wheezing, rales,rhonchi or crepitation. No use of accessory muscles of respiration.  CARDIOVASCULAR: S1, S2 normal. No murmurs, rubs, or gallops.  ABDOMEN: Soft, nontender, nondistended. Bowel sounds present. No organomegaly or mass.  EXTREMITIES: No pedal edema, cyanosis, or clubbing. Left lower extremity decreased range of motion NEUROLOGIC: Cranial nerves II through XII are intact. Muscle strength 5/5 in all extremities. Sensation intact. Gait not checked.  PSYCHIATRIC: The patient is alert and oriented x 3.  SKIN: No obvious rash, lesion, or ulcer.   LABORATORY PANEL:   CBC Recent Labs  Lab 09/19/18 1455  WBC 15.7*  HGB 10.6*  HCT 31.1*  PLT 225   ------------------------------------------------------------------------------------------------------------------  Chemistries  No results for input(s): NA, K, CL, CO2, GLUCOSE, BUN, CREATININE, CALCIUM, MG, AST, ALT, ALKPHOS, BILITOT in the last 168 hours.  Invalid input(s): GFRCGP ------------------------------------------------------------------------------------------------------------------  Cardiac Enzymes No results for input(s): TROPONINI in the last 168 hours. ------------------------------------------------------------------------------------------------------------------  RADIOLOGY:  Dg Chest 1 View  Result Date: 09/19/2018 CLINICAL DATA:  Pain status post fall. EXAM: CHEST  1 VIEW COMPARISON:  Chest x-ray from 2016 could not be retrieved for comparison at the time of this dictation.  FINDINGS: The heart size is enlarged. Aortic calcifications are noted. There is no pneumothorax. No large pleural effusion. No focal infiltrate. IMPRESSION: No active disease. Electronically Signed   By: Constance Holster M.D.   On: 09/19/2018 14:45   Dg Finger Middle Right  Result Date: 09/19/2018 CLINICAL DATA:  Pain status post fall EXAM: RIGHT MIDDLE FINGER 2+V COMPARISON:  None. FINDINGS: There is a subtle lucency through the base of the proximal phalanx of the third digit. There are advanced degenerative changes of the distal interphalangeal joint of the third digit. There is soft tissue swelling. There is some age-indeterminate subluxation of the DIP. IMPRESSION: 1. Lucency through the base of the proximal phalanx of the third digit may represent a nondisplaced fracture. 2. Advanced degenerative changes of the DIP with some dorsal subluxation of the distal phalanx, which is age indeterminate but favored to be chronic and secondary to the advanced degenerative changes. 3. Soft tissue swelling about the third digit. Electronically Signed   By: Constance Holster M.D.   On: 09/19/2018 14:43   Dg Hip Unilat W Or Wo Pelvis 2-3 Views Left  Result Date: 09/19/2018 CLINICAL DATA:  Pain EXAM: DG HIP (WITH OR WITHOUT PELVIS) 2-3V LEFT COMPARISON:  None. FINDINGS: There is an acute displaced fracture of the left femoral neck. There is no dislocation. There are advanced degenerative changes of both hips. There is diffuse osteopenia. There is some regularity of the pubic rami on the left  which may represent old healed fractures. IMPRESSION: Acute displaced fracture of the proximal left femur as above. Electronically Signed   By: Constance Holster M.D.   On: 09/19/2018 14:41    EKG:    IMPRESSION AND PLAN:   Dossie Swor  is a 63 y.o. male with a known history of hypertension, diabetes, hyperlipidemia and severe sleep apnea where CPAP comes to the emergency room after he fell tripped on a few boxes at  [employer] where patient works.  1. Left acute hip fracture/femoral neck fracture status post mechanical fall at work -mid to orthopedic floor -orthopedic consultation for Dr. Roland Rack -NPO after midnight, IV fluids, PO and PRN IV pain meds  2. Hypertension  -continue Coreg, amlodipine, lisinopril, hydralazine and Maxizde  3. Hyperlipidemia continue statins  4. Type II diabetes patient on metformin. Will give sliding scale insulin for now  5. Leukocytosis appears reactive.  6. DVT prophylaxis SCD pending hip surgery  7. Severe obstructive sleep apnea. Patient uses CPAP at home. I requested him to ask his wife to bring his CPAP machine while he is here in the hospital     All the records are reviewed and case discussed with ED provider.   CODE STATUS: full  TOTAL TIME TAKING CARE OF THIS PATIENT: 50 minutes.    Fritzi Mandes M.D on 09/19/2018 at 3:21 PM  Between 7am to 6pm - Pager - [phone]  After 6pm go to [email or web address] - password [email or web address]  [facility]  Office  [phone]  CC: Primary care physician; Rusty Aus, MD

## 2018-09-19 NOTE — ED Notes (Signed)
Walking swab to lab.

## 2018-09-19 NOTE — ED Triage Notes (Addendum)
Pt comes via [facility] from work at [employer] after [employer] fall. EMS reports pt was walking and tripped over some boxes and fell. Pt c/o left hip and leg pain. Pt unable to bear weight on leg. EMS reports pt didn't hit his head or LOC.  Pt alert and oriented. Pt states 7/10. VSS

## 2018-09-19 NOTE — ED Provider Notes (Signed)
[facility] Emergency Department Provider Note   ____________________________________________   First MD Initiated Contact with Patient 09/19/18 1304     (approximate)  I have reviewed the triage vital signs and the nursing notes.   HISTORY  Chief Complaint Fall    HPI Eric Russell is a 63 y.o. 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  Fell onto his left hip area and was having severe pain unable to get up because of pain, points over his left hip. No nausea vomiting.  Reports severe pain with movement, mild to moderate pain at rest.  No nausea vomiting.  No head strike.  Did not injure his arms or legs except he did bang his right middle finger and it is bruised and has slight abrasion over it.  Is able to use it and bend it okay.  No chest pain or trouble breathing.  Reports he simply slipped over a box fell on his left side.  He not having numbness in the toes or weakness in the toes on either foot.  No injury to the right leg.   Past Medical History:  Diagnosis Date  . Arthritis   . Cancer [facility])    prostate  . Diabetes mellitus without complication ([place])   . Hypertension   . Sleep apnea     Patient Active Problem List   Diagnosis Date Noted  . Primary localized osteoarthritis of right knee 06/29/2016    Past Surgical History:  Procedure Laterality Date  . CHOLECYSTECTOMY    . MOHS SURGERY    . TOTAL KNEE ARTHROPLASTY Right 06/29/2016   Procedure: TOTAL KNEE ARTHROPLASTY;  Surgeon: Hessie Knows, MD;  Location: [facility] ORS;  Service: Orthopedics;  Laterality: Right;    Prior to Admission medications   Medication Sig Start Date End Date Taking? Authorizing Provider  acetaminophen (TYLENOL) 650 MG CR tablet Take 1,300 mg by mouth 2 (two) times daily.    [provider]  amLODipine (NORVASC) 10 MG tablet Take 5 mg by mouth 2 (two) times daily.    [provider]   carvedilol (COREG) 25 MG tablet Take 50 mg by mouth 2 (two) times daily with a meal.    [provider]  docusate sodium (COLACE) 100 MG capsule Take 1 capsule (100 mg total) by mouth 2 (two) times daily. 07/01/16   Duanne Guess, PA-C  enoxaparin (LOVENOX) 30 MG/0.3ML injection Inject 0.3 mLs (30 mg total) into the skin daily. 07/01/16 07/15/16  Duanne Guess, PA-C  furosemide (LASIX) 20 MG tablet Take 10 mg by mouth daily.    [provider]  hydrALAZINE (APRESOLINE) 50 MG tablet Take 75 mg by mouth 2 (two) times daily.    [provider]  lisinopril (PRINIVIL,ZESTRIL) 20 MG tablet Take 20 mg by mouth daily.    [provider]  metFORMIN (GLUCOPHAGE) 500 MG tablet Take 1,000 mg by mouth every evening.    [provider]  Multiple Vitamins-Minerals (MULTIVITAMIN WITH MINERALS) tablet Take 1 tablet by mouth daily.    [provider]  oxyCODONE (OXY IR/ROXICODONE) 5 MG immediate release tablet Take 1-2 tablets (5-10 mg total) by mouth every 3 (three) hours as needed for breakthrough pain. 07/01/16   Duanne Guess, PA-C  PRESCRIPTION MEDICATION Pt uses CPAP Machine    [provider]  sodium chloride (OCEAN) 0.65 % SOLN nasal spray Place 1 spray into both nostrils  at bedtime as needed for congestion.    [provider]  traMADol (ULTRAM) 50 MG tablet Take 1-2 tablets (50-100 mg total) by mouth every 6 (six) hours as needed for moderate pain. 07/01/16   Duanne Guess, PA-C  triamterene-hydrochlorothiazide (MAXZIDE) 75-50 MG tablet Take 1 tablet by mouth daily.    [provider]  Turmeric 500 MG CAPS Take 2 capsules by mouth daily.    [provider]    Allergies Patient has no known allergies.  No family history on file.  Social History Social History   Tobacco Use  . Smoking status: Never Smoker  . Smokeless tobacco: Never Used  Substance Use Topics  . Alcohol use: No  . Drug use: No     Review of Systems Constitutional: No fever/chills, he does not think he has "a virus", he is not any signs or symptoms or been obviously exposed anyone known to have COVID though he does work in a general public Eyes: No visual changes. ENT: No sore throat. Cardiovascular: Denies chest pain. Respiratory: Denies shortness of breath. Gastrointestinal: No abdominal pain.   Genitourinary: Negative for dysuria. Musculoskeletal: Negative for back pain.  See HPI, notable left hip pain Skin: Negative for rash. Neurological: Negative for headaches, areas of focal weakness or numbness.    ____________________________________________   PHYSICAL EXAM:  VITAL SIGNS: ED Triage Vitals  Enc Vitals Group     BP 09/19/18 1220 (!) 160/89     Pulse Rate 09/19/18 1210 60     Resp 09/19/18 1210 14     Temp 09/19/18 1220 98.5 F (36.9 C)     Temp Source 09/19/18 1220 Oral     SpO2 09/19/18 1210 99 %     Weight 09/19/18 1220 235 lb (106.6 kg)     Height 09/19/18 1220 5\' 11"  (1.803 m)     Head Circumference --      Peak Flow --      Pain Score 09/19/18 1220 6     Pain Loc --      Pain Edu? --      Excl. in [place]? --     Constitutional: Alert and oriented. Well appearing and in no acute distress.  Is pleasant, winces in pain that whenever he moves reports pain over the left hip joint Eyes: Conjunctivae are normal. Head: Atraumatic. Nose: No congestion/rhinnorhea. Mouth/Throat: Mucous membranes are moist. Neck: No stridor.  Cardiovascular: Normal rate, regular rhythm. Grossly normal heart sounds.  Good peripheral circulation. Respiratory: Normal respiratory effort.  No retractions. Lungs CTAB. Gastrointestinal: Soft and nontender. No distention. Musculoskeletal:   RIGHT Right upper extremity demonstrates normal strength, good use of all muscles. No edema bruising or contusions of the right shoulder/upper arm, right elbow, right forearm / hand except for some slight bruising about the middle of  the right middle phalanx without obvious deformity. Full range of motion of the right right upper extremity without pain a little bit tender in the middle of the right middle finger. No evidence of trauma. Strong radial pulse. Intact median/ulnar/radial neuro-muscular exam.  No Rings on the right hand  LEFT Left upper extremity demonstrates normal strength, good use of all muscles. No edema bruising or contusions of the left shoulder/upper arm, left elbow, left forearm / hand. Full range of motion of the left  upper extremity without pain. No evidence of trauma. Strong radial pulse. Intact median/ulnar/radial neuro-muscular exam.  Lower Extremities  No edema. Normal DP/PT pulses bilateral with good cap refill.  Normal neuro-motor function lower extremities bilateral.  RIGHT Right lower extremity demonstrates normal strength, good use of all muscles. No edema bruising or contusions of the right hip, right knee, right ankle. Full range of motion of the right lower extremity without pain. No pain on axial loading. No evidence of trauma.  LEFT Left lower extremity demonstrates normal strength limited unable to flex or extend hip due to pain, good use of all muscles of the foot ankle region. No edema bruising or contusions of theknee, ankle.  Notable pain produced in the left hip area with axial loading.  Focal tenderness over the left greater trochanter.  No long bone tenderness over the mid to distal femur or tib-fib.  No tenderness or palpation pain over the foot or ankle.  Palpable dorsalis pedis   Neurologic:  Normal speech and language. No gross focal neurologic deficits are appreciated.  Skin:  Skin is warm, dry and intact. No rash noted. Psychiatric: Mood and affect are normal. Speech and behavior are normal.  ____________________________________________   LABS (all labs ordered are listed, but only abnormal results are displayed)  Labs Reviewed - No data to display  ____________________________________________  EKG  Reviewed entered by me at 1210 Heart rate 60 QRS 120 QTc 420 Normal sinus rhythm, incomplete right bundle ____________________________________________  RADIOLOGY  Dg Chest 1 View  Result Date: 09/19/2018 CLINICAL DATA:  Pain status post fall. EXAM: CHEST  1 VIEW COMPARISON:  Chest x-ray from 2016 could not be retrieved for comparison at the time of this dictation. FINDINGS: The heart size is enlarged. Aortic calcifications are noted. There is no pneumothorax. No large pleural effusion. No focal infiltrate. IMPRESSION: No active disease. Electronically Signed   By: Constance Holster M.D.   On: 09/19/2018 14:45   Dg Finger Middle Right  Result Date: 09/19/2018 CLINICAL DATA:  Pain status post fall EXAM: RIGHT MIDDLE FINGER 2+V COMPARISON:  None. FINDINGS: There is a subtle lucency through the base of the proximal phalanx of the third digit. There are advanced degenerative changes of the distal interphalangeal joint of the third digit. There is soft tissue swelling. There is some age-indeterminate subluxation of the DIP. IMPRESSION: 1. Lucency through the base of the proximal phalanx of the third digit may represent a nondisplaced fracture. 2. Advanced degenerative changes of the DIP with some dorsal subluxation of the distal phalanx, which is age indeterminate but favored to be chronic and secondary to the advanced degenerative changes. 3. Soft tissue swelling about the third digit. Electronically Signed   By: Constance Holster M.D.   On: 09/19/2018 14:43   Dg Hip Unilat W Or Wo Pelvis 2-3 Views Left  Result Date: 09/19/2018 CLINICAL DATA:  Pain EXAM: DG HIP (WITH OR WITHOUT PELVIS) 2-3V LEFT COMPARISON:  None. FINDINGS: There is an acute displaced fracture of the left femoral neck. There is no dislocation. There are advanced degenerative changes of both hips. There is diffuse osteopenia. There is some regularity of the pubic rami on the left  which may represent old healed fractures. IMPRESSION: Acute displaced fracture of the proximal left femur as above. Electronically Signed   By: Constance Holster M.D.   On: 09/19/2018 14:41    ____________________________________________   PROCEDURES  Procedure(s) performed: None  Procedures  Critical Care performed: No  ____________________________________________   INITIAL IMPRESSION / ASSESSMENT AND PLAN / ED COURSE  Pertinent labs & imaging results that were available during my care of the patient were reviewed by me and considered in  my medical decision making (see chart for details).   Mechanical fall.  Focal pain primarily over the left hip, also some slight injury to her right middle finger.  Will image both.  Pain control.  X-rays anticipated  Eric Russell was evaluated in Emergency Department on 09/19/2018 for the symptoms described in the history of present illness. He was evaluated in the context of the global COVID-19 pandemic, which necessitated consideration that the patient might be at risk for infection with the SARS-CoV-2 virus that causes COVID-19. Institutional protocols and algorithms that pertain to the evaluation of patients at risk for COVID-19 are in a state of rapid change based on information released by regulatory bodies including the CDC and federal and state organizations. These policies and algorithms were followed during the patient's care in the ED.  The patient does not have any obvious COVID symptoms    ----------------------------------------- 3:02 PM on 09/19/2018 -----------------------------------------  Dr. Roland Rack of orthopedics advises plans to fix hip tomorrow.  Also aware of finger injury.  Patient will be admitted to hospitalist service, preoperative lab work is pending at the time of admission.  ____________________________________________   FINAL CLINICAL IMPRESSION(S) / ED DIAGNOSES  Final diagnoses:  Pre-op evaluation  Acute  closed left hip fracture Phalanx fracture suspected, right proximal and middle digit     Note:  This document was prepared using Dragon voice recognition software and may include unintentional dictation errors       [name], MD 09/19/18 1503

## 2018-09-19 NOTE — TOC Progression Note (Signed)
Transition of Care [facility]) - Progression Note    Patient Details  Name: Eric Russell MRN: [id number] Date of Birth: [birth year]  Transition of Care [facility]) CM/SW Contact  Marshell Garfinkel, RN Phone Number: 09/19/2018, 3:52 PM  Clinical Narrative:    Message left for Epifania Gore with [employer] Workers' comp claims [phone] for claim number and assistance with discharge planning.       Expected Discharge Plan and Services                                                 Social Determinants of Health (SDOH) Interventions    Readmission Risk Interventions No flowsheet data found.

## 2018-09-19 NOTE — Progress Notes (Signed)
Family Meeting Note  Advance Directive yes Today a meeting took place with the  Pt in ER  Patient came in after mechanical fall at [employer] where he works. Found to have acute femoral fractured neck left side. He has history of diabetes hypertension and hyperlipidemia. Patient otherwise is active and independent. Code status discussed patient wants to be full code. Time spent 16 minutes. Fritzi Mandes, MD

## 2018-09-19 NOTE — ED Notes (Signed)
Pt leaving for imaging.

## 2018-09-19 NOTE — ED Notes (Addendum)
Pt denies hitting head; witnessed fall at work; pt states L hip/leg pain. Pulse in foot 1+. Pt states he took his BP meds at 7:30am this morning.

## 2018-09-19 NOTE — ED Notes (Signed)
Admitting provider at bedside.

## 2018-09-19 NOTE — ED Notes (Signed)
ED TO INPATIENT HANDOFF REPORT  ED Nurse Name and Phone #: Metta Clines [phone]  S Name/Age/Gender Golda Acre 63 y.o. male Room/Bed: ED12A/ED12A  Code Status   Code Status: Prior  Home/SNF/Other Home Patient oriented to: self, place, time and situation Is this baseline? Yes   Triage Complete: Triage complete  Chief Complaint fall  Triage Note Pt comes via [facility] from work at [employer] after [employer] fall. EMS reports pt was walking and tripped over some boxes and fell. Pt c/o left hip and leg pain. Pt unable to bear weight on leg. EMS reports pt didn't hit his head or LOC.  Pt alert and oriented. Pt states 7/10. VSS   Allergies No Known Allergies  Level of Care/Admitting Diagnosis ED Disposition    None      B Medical/Surgery History Past Medical History:  Diagnosis Date  . Arthritis   . Cancer [facility])    prostate  . Diabetes mellitus without complication ([place])   . Hypertension   . Sleep apnea    Past Surgical History:  Procedure Laterality Date  . CHOLECYSTECTOMY    . MOHS SURGERY    . TOTAL KNEE ARTHROPLASTY Right 06/29/2016   Procedure: TOTAL KNEE ARTHROPLASTY;  Surgeon: Hessie Knows, MD;  Location: [facility] ORS;  Service: Orthopedics;  Laterality: Right;     A IV Location/Drains/Wounds Patient Lines/Drains/Airways Status   Active Line/Drains/Airways    Name:   Placement date:   Placement time:   Site:   Days:   Peripheral IV 09/19/18 Left Antecubital   09/19/18    1223    Antecubital   less than 1   Negative Pressure Wound Therapy Knee Right   06/29/16    0804    -   812   Airway   06/29/16    0700     812   Airway 7 mm   06/29/16    0754     812   Incision (Closed) 06/29/16 Knee Right   06/29/16    0806     812          Intake/Output Last 24 hours No intake or output data in the 24 hours ending 09/19/18 1445  Labs/Imaging No results found for this or any previous visit (from the past 48 hour(s)). Dg Hip Unilat W Or Wo Pelvis 2-3 Views  Left  Result Date: 09/19/2018 CLINICAL DATA:  Pain EXAM: DG HIP (WITH OR WITHOUT PELVIS) 2-3V LEFT COMPARISON:  None. FINDINGS: There is an acute displaced fracture of the left femoral neck. There is no dislocation. There are advanced degenerative changes of both hips. There is diffuse osteopenia. There is some regularity of the pubic rami on the left which may represent old healed fractures. IMPRESSION: Acute displaced fracture of the proximal left femur as above. Electronically Signed   By: Constance Holster M.D.   On: 09/19/2018 14:41    Pending Labs Unresulted Labs (From admission, onward)    Start     Ordered   09/19/18 1436  CBC  ONCE - STAT,   STAT     09/19/18 1435   09/19/18 6256  Basic metabolic panel  ONCE - STAT,   STAT     09/19/18 1435   09/19/18 1436  Protime-INR  ONCE - STAT,   STAT     09/19/18 1435   09/19/18 1431  SARS Coronavirus 2 (CEPHEID - Performed in [place] hospital lab), Hosp Order  (Asymptomatic Patients Labs)  ONCE - STAT,  STAT    Question:  Rule Out  Answer:  Yes   09/19/18 1430          Vitals/Pain Today's Vitals   09/19/18 1300 09/19/18 1330 09/19/18 1338 09/19/18 1409  BP: (!) 165/79 (!) 186/89    Pulse: (!) 59 65 63 64  Resp: 11 12 17 13   Temp:      TempSrc:      SpO2: 98% 98% 99% 96%  Weight:      Height:      PainSc:        Isolation Precautions No active isolations  Medications Medications  HYDROmorphone (DILAUDID) injection 0.5 mg (0.5 mg Intravenous Given 09/19/18 1327)    Mobility Usually walks Low fall risk   Focused Assessments Fall at work today; displaced L proximal femor fracture; pulse doppler/1+ in foot; appropriate color/warmth/cap refill   R Recommendations: See Admitting Provider Note  Report given to:   Additional Notes:  20g L ac IV; 0.5 of dilaudid for pain

## 2018-09-19 NOTE — ED Notes (Signed)
Rainbow and type and screen sent to lab

## 2018-09-19 NOTE — ED Notes (Signed)
Pt given another warm blanket.  

## 2018-09-19 NOTE — ED Notes (Signed)
Pt back from imaging

## 2018-09-19 NOTE — ED Notes (Signed)
Pt denies wanting more pain medication at this time but verbalized understanding that more medication is ordered and available. PT has call bell to alert RN when the pain has increased.

## 2018-09-19 NOTE — ED Notes (Signed)
Pt requested to use urinal before receiving pain meds. Given urinal. Will give pain meds once pt done using urinal.

## 2018-09-19 NOTE — ED Notes (Signed)
Pt finished with urinal; 175cc urine; pt repositioned in bed.

## 2018-09-20 LAB — GLUCOSE, CAPILLARY
Glucose-Capillary: 100 mg/dL — ABNORMAL HIGH (ref 70–99)
Glucose-Capillary: 107 mg/dL — ABNORMAL HIGH (ref 70–99)
Glucose-Capillary: 89 mg/dL (ref 70–99)
Glucose-Capillary: 147 mg/dL — ABNORMAL HIGH (ref 70–99)

## 2018-09-20 MED ORDER — TRIAMTERENE-HCTZ 37.5-25 MG PO TABS
2.0000 | ORAL_TABLET | Freq: Every day | ORAL | Status: DC
  Administered 2018-09-20 – 2018-09-23 (×4): 2 via ORAL
  Filled 2018-09-20 (×4): qty 2

## 2018-09-20 NOTE — Progress Notes (Signed)
PT Cancellation Note  Patient Details Name: Eric Russell MRN: [id number] DOB: 1955/11/09   Cancelled Treatment:    Reason Eval/Treat Not Completed: Other (comment). Pt admitted for L acute femur fracture following fall at work. Also noted nondisplaced fx of R middle finger. Pt scheduled for L THR this date. Will complete current order. Please re-order s/p SX with WBing status. Thank you.   Aziyah Provencal 09/20/2018, 8:42 AM  Greggory Stallion, PT, DPT [phone]

## 2018-09-20 NOTE — Progress Notes (Addendum)
[place] [facility] [place] NAME: Eric Russell    MR#:  [id number]  DATE OF BIRTH:  08/17/1955  SUBJECTIVE:  CHIEF COMPLAINT:   Chief Complaint  Patient presents with  . Fall   -Patient active at baseline, had a mechanical fall and comes in with left hip fracture. -For surgery today  REVIEW OF SYSTEMS:  Review of Systems  Constitutional: Negative for chills, fever and malaise/fatigue.  HENT: Negative for ear discharge, hearing loss and nosebleeds.   Eyes: Negative for blurred vision and double vision.  Respiratory: Negative for cough, shortness of breath and wheezing.   Cardiovascular: Negative for chest pain, palpitations and leg swelling.  Gastrointestinal: Negative for abdominal pain, constipation, diarrhea, nausea and vomiting.  Genitourinary: Negative for dysuria.  Musculoskeletal: Positive for joint pain and myalgias.  Neurological: Negative for dizziness, focal weakness, seizures, weakness and headaches.  Psychiatric/Behavioral: Negative for depression.    DRUG ALLERGIES:  No Known Allergies  VITALS:  Blood pressure (!) 165/81, pulse 64, temperature 98.1 F (36.7 C), temperature source Oral, resp. rate 18, height 5\' 11"  (1.803 m), weight 106.6 kg, SpO2 97 %.  PHYSICAL EXAMINATION:  Physical Exam   GENERAL:  63 y.o.-year-old patient lying in the bed with no acute distress.  EYES: Pupils equal, round, reactive to light and accommodation. No scleral icterus. Extraocular muscles intact.  HEENT: Head atraumatic, normocephalic. Oropharynx and nasopharynx clear.  NECK:  Supple, no jugular venous distention. No thyroid enlargement, no tenderness.  LUNGS: Normal breath sounds bilaterally, no wheezing, rales,rhonchi or crepitation. No use of accessory muscles of respiration.  CARDIOVASCULAR: S1, S2 normal. No murmurs, rubs, or gallops.  ABDOMEN: Soft, nontender, nondistended. Bowel sounds present. No organomegaly or mass.   EXTREMITIES: Left leg is abducted and externally rotated.  No pedal edema, cyanosis, or clubbing.  NEUROLOGIC: Cranial nerves II through XII are intact. Muscle strength 5/5 in all extremities except left lower leg, limited range of motion due to pain. Sensation intact. Gait not checked.  PSYCHIATRIC: The patient is alert and oriented x 3.  SKIN: No obvious rash, lesion, or ulcer.    LABORATORY PANEL:   CBC Recent Labs  Lab 09/19/18 1455  WBC 15.7*  HGB 10.6*  HCT 31.1*  PLT 225   ------------------------------------------------------------------------------------------------------------------  Chemistries  Recent Labs  Lab 09/19/18 1455  NA 140  K 3.7  CL 105  CO2 21*  GLUCOSE 128*  BUN 52*  CREATININE 2.45*  CALCIUM 9.2   ------------------------------------------------------------------------------------------------------------------  Cardiac Enzymes No results for input(s): TROPONINI in the last 168 hours. ------------------------------------------------------------------------------------------------------------------  RADIOLOGY:  Dg Chest 1 View  Result Date: 09/19/2018 CLINICAL DATA:  Pain status post fall. EXAM: CHEST  1 VIEW COMPARISON:  Chest x-ray from 2016 could not be retrieved for comparison at the time of this dictation. FINDINGS: The heart size is enlarged. Aortic calcifications are noted. There is no pneumothorax. No large pleural effusion. No focal infiltrate. IMPRESSION: No active disease. Electronically Signed   By: Constance Holster M.D.   On: 09/19/2018 14:45   Dg Finger Middle Right  Result Date: 09/19/2018 CLINICAL DATA:  Pain status post fall EXAM: RIGHT MIDDLE FINGER 2+V COMPARISON:  None. FINDINGS: There is a subtle lucency through the base of the proximal phalanx of the third digit. There are advanced degenerative changes of the distal interphalangeal joint of the third digit. There is soft tissue swelling. There is some age-indeterminate  subluxation of the DIP. IMPRESSION: 1. Lucency through the base  of the proximal phalanx of the third digit may represent a nondisplaced fracture. 2. Advanced degenerative changes of the DIP with some dorsal subluxation of the distal phalanx, which is age indeterminate but favored to be chronic and secondary to the advanced degenerative changes. 3. Soft tissue swelling about the third digit. Electronically Signed   By: Constance Holster M.D.   On: 09/19/2018 14:43   Dg Hip Unilat W Or Wo Pelvis 2-3 Views Left  Result Date: 09/19/2018 CLINICAL DATA:  Pain EXAM: DG HIP (WITH OR WITHOUT PELVIS) 2-3V LEFT COMPARISON:  None. FINDINGS: There is an acute displaced fracture of the left femoral neck. There is no dislocation. There are advanced degenerative changes of both hips. There is diffuse osteopenia. There is some regularity of the pubic rami on the left which may represent old healed fractures. IMPRESSION: Acute displaced fracture of the proximal left femur as above. Electronically Signed   By: Constance Holster M.D.   On: 09/19/2018 14:41    EKG:   Orders placed or performed during the hospital encounter of 09/19/18  . EKG 12-Lead  . EKG 12-Lead    ASSESSMENT AND PLAN:   Eric Russell  is a 63 y.o. male with a known history of hypertension, diabetes, hyperlipidemia and severe sleep apnea where CPAP comes to the emergency room after he fell tripped on a few boxes at work and left hip fracture.  1. Left acute hip fracture/femoral neck fracture status post mechanical fall at work -Appreciate Ortho consult.  Continue pain control -Going for surgery today. -IV fluids, will need physical therapy and DVT prophylaxis after surgery.  Monitor hemoglobin  2. Hypertension  -continue Coreg, amlodipine, lisinopril, hydralazine and Maxizde  3.   CKD stage IV-most recent baseline creatinine as outpatient at 2.4.  Stable at baseline now.  Continue to monitor closely  4. Type II diabetes -on sliding  scale insulin  5. Leukocytosis appears reactive.  Check urine analysis  6. DVT prophylaxis-teds and SCDs only, Lovenox can be started after surgery  7. Severe obstructive sleep apnea. Patient uses CPAP at home.  We will start CPAP    All the records are reviewed and case discussed with Care Management/Social Workerr. Management plans discussed with the patient, family and they are in agreement.  CODE STATUS: Full code  TOTAL TIME TAKING CARE OF THIS PATIENT: 38 minutes.   POSSIBLE D/C IN 2-3 DAYS, DEPENDING ON CLINICAL CONDITION.   Gladstone Lighter M.D on 09/20/2018 at 1:06 PM  Between 7am to 6pm - Pager - [phone]  After 6pm go to [email or web address] - password [email or web address] [facility]  Office  [phone]  CC: Primary care physician; Rusty Aus, MD

## 2018-09-20 NOTE — Progress Notes (Signed)
Because of OR emergencies surgery could not be done today.  I will be able do that tomorrow for Dr. Roland Rack.  Orders entered for diet tonight and n.p.o. after midnight as well as consent.

## 2018-09-20 NOTE — Progress Notes (Signed)
Spoke with patient's wife and she asked me to note that patient has severe sleep apnea and she would like for anesthesia to know before he goes into surgery because there was an instance where he went into a coma during surgery.

## 2018-09-20 NOTE — NC FL2 (Signed)
[place] LEVEL OF CARE SCREENING TOOL     IDENTIFICATION  Patient Name: Eric Russell Birthdate: 1956-03-08 Sex: male Admission Date (Current Location): 09/19/2018  [facility] and [place] Number:  Engineering geologist and Address:         Provider Number: [id number]  Attending Physician Name and Address:  Gladstone Lighter, MD  Relative Name and Phone Number:       Current Level of Care: Hospital Recommended Level of Care: [place] Prior Approval Number:    Date Approved/Denied:   PASRR Number: [id number]  Discharge Plan: SNF    Current Diagnoses: Patient Active Problem List   Diagnosis Date Noted  . Hip fracture ([place]) 09/19/2018  . Primary localized osteoarthritis of right knee 06/29/2016    Orientation RESPIRATION BLADDER Height & Weight     Self, Time, Situation, Place  Normal Continent Weight: 235 lb (106.6 kg) Height:  5\' 11"  (180.3 cm)  BEHAVIORAL SYMPTOMS/MOOD NEUROLOGICAL BOWEL NUTRITION STATUS      Continent Diet(Diet: Regular)  AMBULATORY STATUS COMMUNICATION OF NEEDS Skin   Extensive Assist Verbally Surgical wounds                       Personal Care Assistance Level of Assistance  Bathing, Feeding, Dressing Bathing Assistance: Limited assistance Feeding assistance: Independent Dressing Assistance: Limited assistance     Functional Limitations Info  Sight, Hearing, Speech Sight Info: Adequate Hearing Info: Adequate Speech Info: Adequate    SPECIAL CARE FACTORS FREQUENCY  PT (By licensed PT), OT (By licensed OT)     PT Frequency: 5 OT Frequency: 5            Contractures      Additional Factors Info  Code Status, Allergies Code Status Info: Full Code. Allergies Info: No Known Allergies.           Current Medications (09/20/2018):  This is the current hospital active medication list Current Facility-Administered Medications  Medication Dose Route Frequency Provider Last Rate Last Dose   . 0.9 %  sodium chloride infusion   Intravenous Continuous Fritzi Mandes, MD 75 mL/hr at 09/20/18 8882    . amLODipine (NORVASC) tablet 10 mg  10 mg Oral Daily Fritzi Mandes, MD   10 mg at 09/20/18 1605  . carvedilol (COREG) tablet 50 mg  50 mg Oral BID WC Fritzi Mandes, MD   50 mg at 09/20/18 1622  . ceFAZolin (ANCEF) IVPB 2g/100 mL premix  2 g Intravenous 30 min Pre-Op Poggi, Marshall Cork, MD      . docusate sodium (COLACE) capsule 100 mg  100 mg Oral BID Fritzi Mandes, MD      . furosemide (LASIX) tablet 10 mg  10 mg Oral Daily Fritzi Mandes, MD   10 mg at 09/20/18 1606  . hydrALAZINE (APRESOLINE) tablet 50 mg  50 mg Oral BID Fritzi Mandes, MD   50 mg at 09/20/18 1607  . HYDROcodone-acetaminophen (NORCO/VICODIN) 5-325 MG per tablet 1-2 tablet  1-2 tablet Oral Q6H PRN Fritzi Mandes, MD   2 tablet at 09/20/18 1604  . insulin aspart (novoLOG) injection 0-5 Units  0-5 Units Subcutaneous QHS Fritzi Mandes, MD      . insulin aspart (novoLOG) injection 0-9 Units  0-9 Units Subcutaneous TID WC Fritzi Mandes, MD      . lisinopril (ZESTRIL) tablet 20 mg  20 mg Oral Daily Fritzi Mandes, MD   20 mg at 09/20/18 1607  . morphine 2 MG/ML injection  0.5 mg  0.5 mg Intravenous Q2H PRN Fritzi Mandes, MD   0.5 mg at 09/20/18 1214  . multivitamin with minerals tablet 1 tablet  1 tablet Oral Daily Charlett Nose, RPH      . mupirocin ointment (BACTROBAN) 2 % 1 application  1 application Nasal BID Fritzi Mandes, MD      . polyethylene glycol (MIRALAX / GLYCOLAX) packet 17 g  17 g Oral Daily PRN Fritzi Mandes, MD      . simvastatin (ZOCOR) tablet 10 mg  10 mg Oral QHS Fritzi Mandes, MD      . triamterene-hydrochlorothiazide [facility]) 37.5-25 MG per tablet 2 tablet  2 tablet Oral Daily Gladstone Lighter, MD   2 tablet at 09/20/18 1608     Discharge Medications: Please see discharge summary for a list of discharge medications.  Relevant Imaging Results:  Relevant Lab Results:   Additional Information SSN: [id number]  Lyonel Morejon,  Veronia Beets, LCSW

## 2018-09-20 NOTE — TOC Initial Note (Signed)
Transition of Care [facility]) - Initial/Assessment Note    Patient Details  Name: Eric Russell MRN: [id number] Date of Birth: [birth year]  Transition of [facility]) CM/SW Contact:    Eric Russell, Eric Russell Phone Number: [phone]  09/20/2018, 4:29 PM  Clinical Narrative: Clinical Social Worker ([place]) reviewed chart and noted that patient has a hip fracture and surgery is pending. CSW met with patient alone at bedside today prior to surgery. Patient was alert and oriented X4 and was sitting up in the bed. CSW introduced self and explained role of CSW department. Per patient he was working at [employer] in [place] when he fell. Patient is a worker's comp case. Per patient he lives in [place] with his wife Eric Russell [phone]. CSW explained that after surgery PT will evaluate him and make a recommendation of home health or SNF. Patient prefers to D/C home and stated that he has a rolling walker and may have a bedside commode at home already. Surgery and PT are pending. CSW will continue to follow and assist as needed.                    Patient Goals and CMS Choice        Expected Discharge Plan and Services                                                Prior Living Arrangements/Services                       Activities of Daily Living Home Assistive Devices/Equipment: CBG Meter, CPAP ADL Screening (condition at time of admission) Patient's cognitive ability adequate to safely complete daily activities?: Yes Is the patient deaf or have difficulty hearing?: No Does the patient have difficulty seeing, even when wearing glasses/contacts?: No Does the patient have difficulty concentrating, remembering, or making decisions?: No Patient able to express need for assistance with ADLs?: Yes Does the patient have difficulty dressing or bathing?: No Independently performs ADLs?: Yes (appropriate for developmental age) Does the patient have difficulty walking or climbing  stairs?: No Weakness of Legs: Left Weakness of Arms/Hands: None  Permission Sought/Granted                  Emotional Assessment              Admission diagnosis:  Pre-op evaluation [Z01.818] Patient Active Problem List   Diagnosis Date Noted  . Hip fracture ([place]) 09/19/2018  . Primary localized osteoarthritis of right knee 06/29/2016   PCP:  Rusty Aus, MD Pharmacy:   [id number] [place]Phone: [phone]: [phone]    Social Determinants of Health (SDOH) Interventions    Readmission Risk Interventions No flowsheet data found.

## 2018-09-21 ENCOUNTER — Inpatient Hospital Stay: Payer: Worker's Compensation | Admitting: Certified Registered"

## 2018-09-21 ENCOUNTER — Encounter: Payer: Self-pay | Admitting: Anesthesiology

## 2018-09-21 ENCOUNTER — Encounter
Admission: EM | Disposition: A | Discharge status: Home Health | Payer: Self-pay | Source: Home / Self Care | Attending: Internal Medicine

## 2018-09-21 ENCOUNTER — Inpatient Hospital Stay: Payer: Worker's Compensation

## 2018-09-21 ENCOUNTER — Inpatient Hospital Stay: Payer: BC Managed Care – PPO

## 2018-09-21 HISTORY — PX: TOTAL HIP ARTHROPLASTY: SHX124

## 2018-09-21 LAB — CBC
HCT: 30.7 % — ABNORMAL LOW (ref 39.0–52.0)
Hemoglobin: 10.4 g/dL — ABNORMAL LOW (ref 13.0–17.0)
MCH: 29.7 pg (ref 26.0–34.0)
MCHC: 33.9 g/dL (ref 30.0–36.0)
MCV: 87.7 fL (ref 80.0–100.0)
Platelets: 216 10*3/uL (ref 150–400)
RBC: 3.5 MIL/uL — ABNORMAL LOW (ref 4.22–5.81)
RDW: 12.9 % (ref 11.5–15.5)
WBC: 11.7 10*3/uL — ABNORMAL HIGH (ref 4.0–10.5)
nRBC: 0 % (ref 0.0–0.2)

## 2018-09-21 LAB — URINALYSIS, COMPLETE (UACMP) WITH MICROSCOPIC
Bacteria, UA: NONE SEEN
Bilirubin Urine: NEGATIVE
Glucose, UA: NEGATIVE mg/dL
Hgb urine dipstick: NEGATIVE
Ketones, ur: NEGATIVE mg/dL
Nitrite: NEGATIVE
Protein, ur: 100 mg/dL — AB
Specific Gravity, Urine: 1.012 (ref 1.005–1.030)
Squamous Epithelial / HPF: NONE SEEN (ref 0–5)
pH: 5 (ref 5.0–8.0)

## 2018-09-21 LAB — BASIC METABOLIC PANEL
Anion gap: 9 (ref 5–15)
BUN: 45 mg/dL — ABNORMAL HIGH (ref 8–23)
CO2: 24 mmol/L (ref 22–32)
Calcium: 8.6 mg/dL — ABNORMAL LOW (ref 8.9–10.3)
Chloride: 107 mmol/L (ref 98–111)
Creatinine, Ser: 2.4 mg/dL — ABNORMAL HIGH (ref 0.61–1.24)
GFR calc Af Amer: 32 mL/min — ABNORMAL LOW (ref >60)
GFR calc non Af Amer: 28 mL/min — ABNORMAL LOW (ref >60)
Glucose, Bld: 137 mg/dL — ABNORMAL HIGH (ref 70–99)
Potassium: 3.3 mmol/L — ABNORMAL LOW (ref 3.5–5.1)
Sodium: 140 mmol/L (ref 135–145)

## 2018-09-21 LAB — GLUCOSE, CAPILLARY
Glucose-Capillary: 125 mg/dL — ABNORMAL HIGH (ref 70–99)
Glucose-Capillary: 139 mg/dL — ABNORMAL HIGH (ref 70–99)
Glucose-Capillary: 112 mg/dL — ABNORMAL HIGH (ref 70–99)
Glucose-Capillary: 112 mg/dL — ABNORMAL HIGH (ref 70–99)

## 2018-09-21 SURGERY — ARTHROPLASTY, HIP, TOTAL,POSTERIOR APPROACH
Anesthesia: Choice

## 2018-09-21 SURGERY — ARTHROPLASTY, HIP, TOTAL, ANTERIOR APPROACH
Anesthesia: Spinal | Laterality: Left

## 2018-09-21 MED ORDER — ACETAMINOPHEN 325 MG PO TABS: 325.0000 mg | ORAL_TABLET | Freq: Four times a day (QID) | ORAL | Status: DC | PRN

## 2018-09-21 MED ORDER — ENOXAPARIN SODIUM 40 MG/0.4ML ~~LOC~~ SOLN
40.0000 mg | SUBCUTANEOUS | Status: DC
  Administered 2018-09-22 – 2018-09-23 (×2): 40 mg via SUBCUTANEOUS
  Filled 2018-09-21 (×2): qty 0.4

## 2018-09-21 MED ORDER — TRAMADOL HCL 50 MG PO TABS
50.0000 mg | ORAL_TABLET | Freq: Four times a day (QID) | ORAL | Status: DC
  Administered 2018-09-21 – 2018-09-23 (×8): 50 mg via ORAL
  Filled 2018-09-21 (×9): qty 1

## 2018-09-21 MED ORDER — MAGNESIUM CITRATE PO SOLN
1.0000 | Freq: Once | ORAL | Status: DC | PRN
  Filled 2018-09-21: qty 296

## 2018-09-21 MED ORDER — ZOLPIDEM TARTRATE 5 MG PO TABS: 5.0000 mg | ORAL_TABLET | Freq: Every evening | ORAL | Status: DC | PRN

## 2018-09-21 MED ORDER — SODIUM CHLORIDE 0.9 % IV SOLN
INTRAVENOUS | Status: DC
  Administered 2018-09-21 (×2): via INTRAVENOUS

## 2018-09-21 MED ORDER — BUPIVACAINE HCL (PF) 0.5 % IJ SOLN
INTRAMUSCULAR | Status: DC | PRN
  Administered 2018-09-21: 3 mL

## 2018-09-21 MED ORDER — SODIUM CHLORIDE 0.9 % IV SOLN
INTRAVENOUS | Status: DC | PRN
  Administered 2018-09-21: 13:00:00 60 mL

## 2018-09-21 MED ORDER — TRANEXAMIC ACID-NACL 1000-0.7 MG/100ML-% IV SOLN
INTRAVENOUS | Status: DC | PRN
  Administered 2018-09-21: 1000 mg via INTRAVENOUS

## 2018-09-21 MED ORDER — HYDROCODONE-ACETAMINOPHEN 7.5-325 MG PO TABS
1.0000 | ORAL_TABLET | ORAL | Status: DC | PRN
  Filled 2018-09-21: qty 2
  Filled 2018-09-21: qty 1

## 2018-09-21 MED ORDER — NEOMYCIN-POLYMYXIN B GU 40-200000 IR SOLN
Status: DC | PRN
  Administered 2018-09-21: 2 mL

## 2018-09-21 MED ORDER — PHENOL 1.4 % MT LIQD
1.0000 | OROMUCOSAL | Status: DC | PRN
  Filled 2018-09-21: qty 177

## 2018-09-21 MED ORDER — PROPOFOL 10 MG/ML IV BOLUS
INTRAVENOUS | Status: AC
  Filled 2018-09-21: qty 20

## 2018-09-21 MED ORDER — POTASSIUM CHLORIDE CRYS ER 20 MEQ PO TBCR
40.0000 meq | EXTENDED_RELEASE_TABLET | Freq: Once | ORAL | Status: AC
  Administered 2018-09-21: 40 meq via ORAL
  Filled 2018-09-21: qty 2

## 2018-09-21 MED ORDER — BISACODYL 10 MG RE SUPP
10.0000 mg | Freq: Every day | RECTAL | Status: DC | PRN
  Administered 2018-09-23: 10 mg via RECTAL
  Filled 2018-09-21: qty 1

## 2018-09-21 MED ORDER — MORPHINE SULFATE (PF) 4 MG/ML IV SOLN: 0.5000 mg | INTRAVENOUS | Status: DC | PRN

## 2018-09-21 MED ORDER — ALUM & MAG HYDROXIDE-SIMETH 200-200-20 MG/5ML PO SUSP
30.0000 mL | ORAL | Status: DC | PRN
  Administered 2018-09-21 – 2018-09-23 (×5): 30 mL via ORAL
  Filled 2018-09-21 (×5): qty 30

## 2018-09-21 MED ORDER — TRANEXAMIC ACID 1000 MG/10ML IV SOLN
INTRAVENOUS | Status: AC
  Filled 2018-09-21: qty 10

## 2018-09-21 MED ORDER — METOCLOPRAMIDE HCL 5 MG/ML IJ SOLN: 5.0000 mg | Freq: Three times a day (TID) | INTRAMUSCULAR | Status: DC | PRN

## 2018-09-21 MED ORDER — BUPIVACAINE-EPINEPHRINE 0.25% -1:200000 IJ SOLN
INTRAMUSCULAR | Status: DC | PRN
  Administered 2018-09-21: 30 mL

## 2018-09-21 MED ORDER — DIPHENHYDRAMINE HCL 12.5 MG/5ML PO ELIX: 12.5000 mg | ORAL_SOLUTION | ORAL | Status: DC | PRN

## 2018-09-21 MED ORDER — FENTANYL CITRATE (PF) 100 MCG/2ML IJ SOLN: 25.0000 ug | INTRAMUSCULAR | Status: DC | PRN

## 2018-09-21 MED ORDER — MAGNESIUM HYDROXIDE 400 MG/5ML PO SUSP
30.0000 mL | Freq: Every day | ORAL | Status: DC | PRN
  Administered 2018-09-23: 30 mL via ORAL
  Filled 2018-09-21: qty 30

## 2018-09-21 MED ORDER — GLYCOPYRROLATE 0.2 MG/ML IJ SOLN
INTRAMUSCULAR | Status: DC | PRN
  Administered 2018-09-21: 0.2 mg via INTRAVENOUS

## 2018-09-21 MED ORDER — ONDANSETRON HCL 4 MG PO TABS: 4.0000 mg | ORAL_TABLET | Freq: Four times a day (QID) | ORAL | Status: DC | PRN

## 2018-09-21 MED ORDER — METFORMIN HCL 500 MG PO TABS
500.0000 mg | ORAL_TABLET | Freq: Two times a day (BID) | ORAL | Status: DC
  Administered 2018-09-22 – 2018-09-23 (×3): 500 mg via ORAL
  Filled 2018-09-21 (×3): qty 1

## 2018-09-21 MED ORDER — MIDAZOLAM HCL 2 MG/2ML IJ SOLN
INTRAMUSCULAR | Status: AC
  Filled 2018-09-21: qty 2

## 2018-09-21 MED ORDER — ONDANSETRON HCL 4 MG/2ML IJ SOLN: 4.0000 mg | Freq: Once | INTRAMUSCULAR | Status: DC | PRN

## 2018-09-21 MED ORDER — METOCLOPRAMIDE HCL 10 MG PO TABS: 5.0000 mg | ORAL_TABLET | Freq: Three times a day (TID) | ORAL | Status: DC | PRN

## 2018-09-21 MED ORDER — METHOCARBAMOL 500 MG PO TABS
500.0000 mg | ORAL_TABLET | Freq: Four times a day (QID) | ORAL | Status: DC | PRN
  Filled 2018-09-21: qty 1

## 2018-09-21 MED ORDER — EPHEDRINE SULFATE 50 MG/ML IJ SOLN
INTRAMUSCULAR | Status: DC | PRN
  Administered 2018-09-21: 10 mg via INTRAVENOUS

## 2018-09-21 MED ORDER — MENTHOL 3 MG MT LOZG
1.0000 | LOZENGE | OROMUCOSAL | Status: DC | PRN
  Filled 2018-09-21: qty 9

## 2018-09-21 MED ORDER — PROPOFOL 500 MG/50ML IV EMUL
INTRAVENOUS | Status: DC | PRN
  Administered 2018-09-21: 50 ug/kg/min via INTRAVENOUS

## 2018-09-21 MED ORDER — CEFAZOLIN SODIUM-DEXTROSE 2-4 GM/100ML-% IV SOLN
2.0000 g | Freq: Four times a day (QID) | INTRAVENOUS | Status: AC
  Administered 2018-09-21 – 2018-09-22 (×2): 2 g via INTRAVENOUS
  Filled 2018-09-21 (×2): qty 100

## 2018-09-21 MED ORDER — METHOCARBAMOL 1000 MG/10ML IJ SOLN
500.0000 mg | Freq: Four times a day (QID) | INTRAVENOUS | Status: DC | PRN
  Filled 2018-09-21: qty 5

## 2018-09-21 MED ORDER — CEFAZOLIN SODIUM-DEXTROSE 2-4 GM/100ML-% IV SOLN
INTRAVENOUS | Status: AC
  Administered 2018-09-21: 11:00:00
  Filled 2018-09-21: qty 100

## 2018-09-21 MED ORDER — HYDROCODONE-ACETAMINOPHEN 5-325 MG PO TABS: 1.0000 | ORAL_TABLET | ORAL | Status: DC | PRN

## 2018-09-21 MED ORDER — FENTANYL CITRATE (PF) 100 MCG/2ML IJ SOLN
INTRAMUSCULAR | Status: DC | PRN
  Administered 2018-09-21 (×2): 25 ug via INTRAVENOUS

## 2018-09-21 MED ORDER — ONDANSETRON HCL 4 MG/2ML IJ SOLN
4.0000 mg | Freq: Four times a day (QID) | INTRAMUSCULAR | Status: DC | PRN
  Administered 2018-09-21: 4 mg via INTRAVENOUS
  Filled 2018-09-21: qty 2

## 2018-09-21 MED ORDER — ACETAMINOPHEN 500 MG PO TABS
500.0000 mg | ORAL_TABLET | Freq: Four times a day (QID) | ORAL | Status: AC
  Administered 2018-09-21 – 2018-09-22 (×4): 500 mg via ORAL
  Filled 2018-09-21 (×4): qty 1

## 2018-09-21 MED ORDER — DOCUSATE SODIUM 100 MG PO CAPS
100.0000 mg | ORAL_CAPSULE | Freq: Two times a day (BID) | ORAL | Status: DC
  Administered 2018-09-21 – 2018-09-23 (×4): 100 mg via ORAL
  Filled 2018-09-21 (×5): qty 1

## 2018-09-21 MED ORDER — CEFAZOLIN SODIUM-DEXTROSE 2-3 GM-%(50ML) IV SOLR
INTRAVENOUS | Status: DC | PRN
  Administered 2018-09-21: 2 g via INTRAVENOUS

## 2018-09-21 MED ORDER — FENTANYL CITRATE (PF) 100 MCG/2ML IJ SOLN
INTRAMUSCULAR | Status: AC
  Filled 2018-09-21: qty 2

## 2018-09-21 MED ORDER — MIDAZOLAM HCL 5 MG/5ML IJ SOLN
INTRAMUSCULAR | Status: DC | PRN
  Administered 2018-09-21 (×2): 1 mg via INTRAVENOUS

## 2018-09-21 SURGICAL SUPPLY — 61 items
APL PRP STRL LF DISP 70% ISPRP (MISCELLANEOUS) ×1
BLADE SAGITTAL AGGR TOOTH XLG (BLADE) ×3 IMPLANT
BNDG COHESIVE 6X5 TAN STRL LF (GAUZE/BANDAGES/DRESSINGS) ×9 IMPLANT
CANISTER SUCT 1200ML W/VALVE (MISCELLANEOUS) ×3 IMPLANT
CANISTER WOUND CARE 500ML ATS (WOUND CARE) ×3 IMPLANT
CHLORAPREP W/TINT 26 (MISCELLANEOUS) ×3 IMPLANT
COVER WAND RF STERILE (DRAPES) ×3 IMPLANT
DRAPE C-ARM XRAY 36X54 (DRAPES) ×3 IMPLANT
DRAPE INCISE IOBAN 66X60 STRL (DRAPES) IMPLANT
DRAPE POUCH INSTRU U-SHP 10X18 (DRAPES) ×3 IMPLANT
DRAPE SHEET LG 3/4 BI-LAMINATE (DRAPES) ×9 IMPLANT
DRAPE TABLE BACK 80X90 (DRAPES) ×3 IMPLANT
DRESSING SURGICEL FIBRLLR 1X2 (HEMOSTASIS) ×2 IMPLANT
DRSG OPSITE POSTOP 4X8 (GAUZE/BANDAGES/DRESSINGS) ×6 IMPLANT
DRSG SURGICEL FIBRILLAR 1X2 (HEMOSTASIS) ×6
ELECT BLADE 6.5 EXT (BLADE) ×3 IMPLANT
ELECT REM PT RETURN 9FT ADLT (ELECTROSURGICAL) ×3
ELECTRODE REM PT RTRN 9FT ADLT (ELECTROSURGICAL) ×1 IMPLANT
GLOVE BIOGEL PI IND STRL 9 (GLOVE) ×1 IMPLANT
GLOVE BIOGEL PI INDICATOR 9 (GLOVE) ×2
GLOVE SURG SYN 9.0  PF PI (GLOVE) ×4
GLOVE SURG SYN 9.0 PF PI (GLOVE) ×2 IMPLANT
GOWN SRG 2XL LVL 4 RGLN SLV (GOWNS) ×1 IMPLANT
GOWN STRL NON-REIN 2XL LVL4 (GOWNS) ×3
GOWN STRL REUS W/ TWL LRG LVL3 (GOWN DISPOSABLE) ×1 IMPLANT
GOWN STRL REUS W/TWL LRG LVL3 (GOWN DISPOSABLE) ×3
HEMOVAC 400CC 10FR (MISCELLANEOUS) IMPLANT
HIP FEM HD S 28 (Head) ×2 IMPLANT
HOLDER FOLEY CATH W/STRAP (MISCELLANEOUS) ×3 IMPLANT
HOOD PEEL AWAY FLYTE STAYCOOL (MISCELLANEOUS) ×3 IMPLANT
KIT PREVENA INCISION MGT 13 (CANNISTER) ×3 IMPLANT
LINER DBL MOB SZ 0 52MM (Liner) ×2 IMPLANT
MAT ABSORB  FLUID 56X50 GRAY (MISCELLANEOUS) ×2
MAT ABSORB FLUID 56X50 GRAY (MISCELLANEOUS) ×1 IMPLANT
NDL SAFETY ECLIPSE 18X1.5 (NEEDLE) ×1 IMPLANT
NDL SPNL 20GX3.5 QUINCKE YW (NEEDLE) ×2 IMPLANT
NEEDLE HYPO 18GX1.5 SHARP (NEEDLE) ×3
NEEDLE SPNL 20GX3.5 QUINCKE YW (NEEDLE) ×6 IMPLANT
NS IRRIG 1000ML POUR BTL (IV SOLUTION) ×3 IMPLANT
PACK HIP COMPR (MISCELLANEOUS) ×3 IMPLANT
SCALPEL PROTECTED #10 DISP (BLADE) ×6 IMPLANT
SHELL ACETABULAR SZ 52 DM (Shell) ×2 IMPLANT
SOL PREP PVP 2OZ (MISCELLANEOUS) ×3
SOLUTION PREP PVP 2OZ (MISCELLANEOUS) ×1 IMPLANT
SPONGE DRAIN TRACH 4X4 STRL 2S (GAUZE/BANDAGES/DRESSINGS) ×3 IMPLANT
STAPLER SKIN PROX 35W (STAPLE) ×3 IMPLANT
STEM FEMORAL SZ3  STD COLLARED (Stem) ×2 IMPLANT
STRAP SAFETY 5IN WIDE (MISCELLANEOUS) ×3 IMPLANT
SUT DVC 2 QUILL PDO  T11 36X36 (SUTURE) ×2
SUT DVC 2 QUILL PDO T11 36X36 (SUTURE) ×1 IMPLANT
SUT SILK 0 (SUTURE) ×3
SUT SILK 0 30XBRD TIE 6 (SUTURE) ×1 IMPLANT
SUT V-LOC 90 ABS DVC 3-0 CL (SUTURE) ×3 IMPLANT
SUT VIC AB 1 CT1 36 (SUTURE) ×3 IMPLANT
SYR 20CC LL (SYRINGE) ×3 IMPLANT
SYR 30ML LL (SYRINGE) ×3 IMPLANT
SYR 50ML LL SCALE MARK (SYRINGE) ×6 IMPLANT
SYR BULB IRRIG 60ML STRL (SYRINGE) ×3 IMPLANT
TAPE MICROFOAM 4IN (TAPE) ×3 IMPLANT
TOWEL OR 17X26 4PK STRL BLUE (TOWEL DISPOSABLE) ×3 IMPLANT
TRAY FOLEY MTR SLVR 16FR STAT (SET/KITS/TRAYS/PACK) ×3 IMPLANT

## 2018-09-21 SURGICAL SUPPLY — 54 items
APL PRP STRL LF DISP 70% ISPRP (MISCELLANEOUS) ×1
BLADE SAGITTAL WIDE XTHICK NO (BLADE) ×4 IMPLANT
BLADE SURG SZ20 CARB STEEL (BLADE) ×4 IMPLANT
CANISTER SUCT 1200ML W/VALVE (MISCELLANEOUS) ×4 IMPLANT
CANISTER SUCT 3000ML PPV (MISCELLANEOUS) ×8 IMPLANT
CHLORAPREP W/TINT 26 (MISCELLANEOUS) ×4 IMPLANT
COVER WAND RF STERILE (DRAPES) ×4 IMPLANT
DRAPE IMP U-DRAPE 54X76 (DRAPES) ×4 IMPLANT
DRAPE INCISE IOBAN 66X60 STRL (DRAPES) ×4 IMPLANT
DRAPE SHEET LG 3/4 BI-LAMINATE (DRAPES) ×4 IMPLANT
DRAPE SURG 17X11 SM STRL (DRAPES) ×8 IMPLANT
DRSG OPSITE POSTOP 4X10 (GAUZE/BANDAGES/DRESSINGS) ×4 IMPLANT
ELECT BLADE 6.5 EXT (BLADE) ×4 IMPLANT
ELECT CAUTERY BLADE 6.4 (BLADE) ×4 IMPLANT
GLOVE BIO SURGEON STRL SZ7.5 (GLOVE) ×16 IMPLANT
GLOVE BIO SURGEON STRL SZ8 (GLOVE) ×16 IMPLANT
GLOVE BIOGEL PI IND STRL 8 (GLOVE) ×2 IMPLANT
GLOVE BIOGEL PI INDICATOR 8 (GLOVE) ×2
GLOVE INDICATOR 8.0 STRL GRN (GLOVE) ×4 IMPLANT
GOWN STRL REUS W/ TWL LRG LVL3 (GOWN DISPOSABLE) ×2 IMPLANT
GOWN STRL REUS W/ TWL XL LVL3 (GOWN DISPOSABLE) ×2 IMPLANT
GOWN STRL REUS W/TWL LRG LVL3 (GOWN DISPOSABLE) ×3
GOWN STRL REUS W/TWL XL LVL3 (GOWN DISPOSABLE) ×3
HOOD PEEL AWAY FLYTE STAYCOOL (MISCELLANEOUS) ×12 IMPLANT
KIT TURNOVER KIT A (KITS) ×4 IMPLANT
MAT ABSORB  FLUID 56X50 GRAY (MISCELLANEOUS) ×2
MAT ABSORB FLUID 56X50 GRAY (MISCELLANEOUS) ×2 IMPLANT
NDL FILTER BLUNT 18X1 1/2 (NEEDLE) ×1 IMPLANT
NDL SAFETY ECLIPSE 18X1.5 (NEEDLE) ×4 IMPLANT
NDL SPNL 20GX3.5 QUINCKE YW (NEEDLE) ×1 IMPLANT
NEEDLE FILTER BLUNT 18X 1/2SAF (NEEDLE) ×2
NEEDLE FILTER BLUNT 18X1 1/2 (NEEDLE) ×1 IMPLANT
NEEDLE HYPO 18GX1.5 SHARP (NEEDLE) ×6
NEEDLE SPNL 20GX3.5 QUINCKE YW (NEEDLE) ×3 IMPLANT
PACK HIP PROSTHESIS (MISCELLANEOUS) ×4 IMPLANT
PILLOW ABDUCTION FOAM SM (MISCELLANEOUS) ×4 IMPLANT
PIN STEINMAN 3/16 (PIN) ×4 IMPLANT
PULSAVAC PLUS IRRIG FAN TIP (DISPOSABLE) ×3
SOL .9 NS 3000ML IRR  AL (IV SOLUTION) ×2
SOL .9 NS 3000ML IRR AL (IV SOLUTION) ×1
SOL .9 NS 3000ML IRR UROMATIC (IV SOLUTION) ×2 IMPLANT
SPONGE LAP 18X18 RF (DISPOSABLE) ×4 IMPLANT
STAPLER SKIN PROX 35W (STAPLE) ×4 IMPLANT
SUT TICRON 2-0 30IN 311381 (SUTURE) ×12 IMPLANT
SUT VIC AB 0 CT1 36 (SUTURE) ×4 IMPLANT
SUT VIC AB 1 CT1 36 (SUTURE) ×8 IMPLANT
SUT VIC AB 2-0 CT1 (SUTURE) ×16 IMPLANT
SYR 10ML LL (SYRINGE) ×4 IMPLANT
SYR 20CC LL (SYRINGE) ×4 IMPLANT
SYR 30ML LL (SYRINGE) ×12 IMPLANT
TAPE TRANSPORE STRL 2 31045 (GAUZE/BANDAGES/DRESSINGS) ×4 IMPLANT
TIP FAN IRRIG PULSAVAC PLUS (DISPOSABLE) ×2 IMPLANT
TRAY FOLEY MTR SLVR 16FR STAT (SET/KITS/TRAYS/PACK) ×4 IMPLANT
WATER STERILE IRR 1000ML POUR (IV SOLUTION) ×4 IMPLANT

## 2018-09-21 NOTE — TOC Progression Note (Addendum)
Transition of Care [facility]) - Progression Note    Patient Details  Name: Eric Russell MRN: [id number] Date of Birth: [birth year]  Transition of Care [facility]) CM/SW Contact  Clevland Cork, Lenice Llamas Phone Number: [phone]  09/21/2018, 9:42 AM  Clinical Narrative: Clinical Social Worker (CSW) attempted to call worker's comp adjuster [phone] however adjuster did not answer and a voicemail was left. CSW also contacted Ronalee Belts at [employer] [phone] however he did not answer and a voicemail was left.   CSW received a call back from worker's comp claims adjuster Thornell Sartorius and made her aware that patient will have surgery today and PT will evaluate him tomorrow. CSW made Doniela aware that PT will recommend home health or SNF. Doniela requested clinicals to be faxed to her at [phone]. CSW faxed requested clinicals. Doniela requested that [place] email her what PT's recommendation is tomorrow at [email or web address]. CSW also received a call back from Ronalee Belts in the claims department stating that he was talked with patient's wife [name] and she is expecting for patient to discharge home Sunday. CSW will continue to follow and assist as needed.          Expected Discharge Plan and Services                                                 Social Determinants of Health (SDOH) Interventions    Readmission Risk Interventions No flowsheet data found.

## 2018-09-21 NOTE — Anesthesia Preprocedure Evaluation (Signed)
Anesthesia Evaluation  Patient identified by MRN, date of birth, ID band Patient awake    Reviewed: Allergy & Precautions, NPO status , Patient's Chart, lab work & pertinent test results  History of Anesthesia Complications Negative for: history of anesthetic complications  Airway Mallampati: III       Dental  (+) Dental Advidsory Given   Pulmonary neg shortness of breath, sleep apnea and Continuous Positive Airway Pressure Ventilation , neg recent URI,           Cardiovascular hypertension, Pt. on medications and Pt. on home beta blockers (-) angina(-) Past MI (-) dysrhythmias (-) Valvular Problems/Murmurs     Neuro/Psych negative neurological ROS     GI/Hepatic negative GI ROS, Neg liver ROS,   Endo/Other  diabetes, Type 2, Oral Hypoglycemic Agents  Renal/GU negative Renal ROS     Musculoskeletal   Abdominal   Peds  Hematology negative hematology ROS (+)   Anesthesia Other Findings Past Medical History: No date: Arthritis No date: Cancer ([place])     Comment:  prostate No date: Diabetes mellitus without complication (HCC) No date: Hypertension No date: Sleep apnea   Reproductive/Obstetrics                             Anesthesia Physical  Anesthesia Plan  ASA: III  Anesthesia Plan: Spinal   Post-op Pain Management:    Induction:   PONV Risk Score and Plan: 1 and Propofol infusion, TIVA and Treatment may vary due to age or medical condition  Airway Management Planned: Simple Face Mask and Natural Airway  Additional Equipment:   Intra-op Plan:   Post-operative Plan:   Informed Consent: I have reviewed the patients History and Physical, chart, labs and discussed the procedure including the risks, benefits and alternatives for the proposed anesthesia with the patient or authorized representative who has indicated his/her understanding and acceptance.       Plan Discussed  with:   Anesthesia Plan Comments:         Anesthesia Quick Evaluation

## 2018-09-21 NOTE — Plan of Care (Signed)

## 2018-09-21 NOTE — Evaluation (Signed)
Physical Therapy Evaluation Patient Details Name: Eric Russell MRN: [id number] DOB: 07-05-1955 Today's Date: 09/21/2018   History of Present Illness  presented to ER after mechanical fall in home environment, acute onset of L hip pain; admitted with L femoral neck fracture, s/p L THA (anterior approach, WBAT).  Also noted with R 3rd digit fracture (base of proximal phalanx); no treatment required per MD  Clinical Impression  Upon evaluation, patient alert and oriented; agreeable to session.  Pain well-controlled, rated 0-1 throughout session.  Demonstrates excellent post-op strength and ROM with supine therex; good isolated strength and motor control.  Currently requiring sup for bed mobility; cga/min assist for sit/stand, basic transfers and gait (5') with RW.  Fair/good efforts to integrate L LE into functional tranfsers, fair/good tolerance for WBing.  Anticipate consistent progress towards all mobility goals throughout course of rehab. Patient very eager to participate/progress with clear goal of returning home at discharge. Would benefit from skilled PT to address above deficits and promote optimal return to PLOF; Recommend transition to [place] upon discharge from acute hospitalization.     Follow Up Recommendations Home health PT    Equipment Recommendations       Recommendations for Other Services       Precautions / Restrictions Precautions Precautions: Fall;Anterior Hip Restrictions Weight Bearing Restrictions: Yes LLE Weight Bearing: Weight bearing as tolerated      Mobility  Bed Mobility Overal bed mobility: Needs Assistance Bed Mobility: Supine to Sit     Supine to sit: Supervision        Transfers Overall transfer level: Needs assistance Equipment used: Rolling walker (2 wheeled) Transfers: Sit to/from Stand Sit to Stand: Min guard;Min assist         General transfer comment: good efforts to integrate L LE use as  appropriate  Ambulation/Gait Ambulation/Gait assistance: Min guard;Min assist Gait Distance (Feet): 5 Feet Assistive device: Rolling walker (2 wheeled)       General Gait Details: partially reciprocal stepping pattern; fair/good tolerance for L LE WBing  Stairs            Wheelchair Mobility    Modified Rankin (Stroke Patients Only)       Balance Overall balance assessment: Needs assistance Sitting-balance support: No upper extremity supported;Feet supported Sitting balance-Leahy Scale: Good     Standing balance support: No upper extremity supported Standing balance-Leahy Scale: Fair                               Pertinent Vitals/Pain Pain Assessment: No/denies pain    Home Living Family/patient expects to be discharged to:: Private residence Living Arrangements: Spouse/significant other Available Help at Discharge: Family;Available 24 hours/day Type of Home: House Home Access: Level entry     Home Layout: Two level;Bed/bath upstairs;1/2 bath on main level Home Equipment: Walker - 2 wheels;Cane - single point      Prior Function Level of Independence: Independent         Comments: Indep with ADLs, household and community mobilization without assist device; working at [employer]        Extremity/Trunk Assessment   Upper Extremity Assessment Upper Extremity Assessment: Overall WFL for tasks assessed    Lower Extremity Assessment Lower Extremity Assessment: (L LE grossly at least 3-/5; good isolated strength and ROM; full sensory return.  R LE Grossly WFL)       Communication   Communication: No difficulties  Cognition Arousal/Alertness: Awake/alert Behavior During Therapy: WFL for tasks assessed/performed Overall Cognitive Status: Within Functional Limits for tasks assessed                                        General Comments      Exercises Other Exercises Other Exercises: Supine L LE  therex, 1x10, AROM for muscular strength/endurance: ankle pumps, quad sets, SAQs, heel slides, hip abdct/adduct. Good isolated strength and motor control; minima/no reports of pain with therex   Assessment/Plan    PT Assessment Patient needs continued PT services  PT Problem List Decreased strength;Decreased activity tolerance;Decreased mobility;Decreased safety awareness;Decreased range of motion;Decreased balance;Decreased coordination;Decreased knowledge of precautions;Decreased knowledge of use of DME;Pain       PT Treatment Interventions DME instruction;Stair training;Therapeutic activities;Balance training;Gait training;Functional mobility training;Therapeutic exercise;Neuromuscular re-education;Patient/family education    PT Goals (Current goals can be found in the Care Plan section)  Acute Rehab PT Goals Patient Stated Goal: to return home at discharge PT Goal Formulation: With patient Time For Goal Achievement: 10/05/18 Potential to Achieve Goals: Good    Frequency BID   Barriers to discharge        Co-evaluation               AM-PAC PT "6 Clicks" Mobility  Outcome Measure Help needed turning from your back to your side while in a flat bed without using bedrails?: None Help needed moving from lying on your back to sitting on the side of a flat bed without using bedrails?: None Help needed moving to and from a bed to a chair (including a wheelchair)?: A Little Help needed standing up from a chair using your arms (e.g., wheelchair or bedside chair)?: A Little Help needed to walk in hospital room?: A Little Help needed climbing 3-5 steps with a railing? : A Little 6 Click Score: 20    End of Session Equipment Utilized During Treatment: Gait belt Activity Tolerance: Patient tolerated treatment well   Nurse Communication: Mobility status PT Visit Diagnosis: Muscle weakness (generalized) (M62.81);Difficulty in walking, not elsewhere classified (R26.2)    Time:  0867-6195 PT Time Calculation (min) (ACUTE ONLY): 29 min   Charges:   PT Evaluation $PT Eval Moderate Complexity: 1 Mod PT Treatments $Therapeutic Exercise: 8-22 mins       Clotilda Hafer H. Owens Shark, PT, DPT, NCS 09/21/18, 11:46 PM [phone]

## 2018-09-21 NOTE — Anesthesia Post-op Follow-up Note (Signed)
Anesthesia QCDR form completed.        

## 2018-09-21 NOTE — Anesthesia Procedure Notes (Signed)
Spinal  Patient location during procedure: OR Start time: 09/21/2018 11:37 AM End time: 09/21/2018 12:00 PM Staffing Anesthesiologist: Martha Clan, MD Resident/CRNA: Disser, Einar Grad, CRNA Performed: anesthesiologist and resident/CRNA  Preanesthetic Checklist Completed: patient identified, site marked, surgical consent, pre-op evaluation, timeout performed, IV checked, risks and benefits discussed and monitors and equipment checked Spinal Block Patient position: left lateral decubitus Prep: ChloraPrep Patient monitoring: heart rate, continuous pulse ox, blood pressure and cardiac monitor Approach: midline Location: L3-4 Injection technique: single-shot Needle Needle type: [place] and Introducer  Needle gauge: 24 G Needle length: 9 cm Assessment Sensory level: T10 Additional Notes Negative paresthesia. Negative blood return. Positive free-flowing CSF. Expiration date of kit checked and confirmed. Patient tolerated procedure well, without complications.

## 2018-09-21 NOTE — Progress Notes (Signed)
[place] [facility] [place] NAME: Eric Russell    MR#:  [id number]  DATE OF BIRTH:  Aug 17, 1955  SUBJECTIVE:  CHIEF COMPLAINT:   Chief Complaint  Patient presents with  . Fall   -Patient active at baseline, had a mechanical fall and comes in with left hip fracture. -Surgery could not be done yesterday due to several over emergencies -For surgery today  REVIEW OF SYSTEMS:  Review of Systems  Constitutional: Negative for chills, fever and malaise/fatigue.  HENT: Negative for ear discharge, hearing loss and nosebleeds.   Eyes: Negative for blurred vision and double vision.  Respiratory: Negative for cough, shortness of breath and wheezing.   Cardiovascular: Negative for chest pain, palpitations and leg swelling.  Gastrointestinal: Negative for abdominal pain, constipation, diarrhea, nausea and vomiting.  Genitourinary: Negative for dysuria.  Musculoskeletal: Positive for joint pain and myalgias.  Neurological: Negative for dizziness, focal weakness, seizures, weakness and headaches.  Psychiatric/Behavioral: Negative for depression.    DRUG ALLERGIES:  No Known Allergies  VITALS:  Blood pressure (!) 157/82, pulse 62, temperature 98.7 F (37.1 C), temperature source Oral, resp. rate 17, height 5\' 11"  (1.803 m), weight 106.6 kg, SpO2 96 %.  PHYSICAL EXAMINATION:  Physical Exam   GENERAL:  63 y.o.-year-old patient lying in the bed with no acute distress.  EYES: Pupils equal, round, reactive to light and accommodation. No scleral icterus. Extraocular muscles intact.  HEENT: Head atraumatic, normocephalic. Oropharynx and nasopharynx clear.  NECK:  Supple, no jugular venous distention. No thyroid enlargement, no tenderness.  LUNGS: Normal breath sounds bilaterally, no wheezing, rales,rhonchi or crepitation. No use of accessory muscles of respiration.  CARDIOVASCULAR: S1, S2 normal. No murmurs, rubs, or gallops.  ABDOMEN: Soft, nontender,  nondistended. Bowel sounds present. No organomegaly or mass.  EXTREMITIES: Left leg is abducted and externally rotated.  No pedal edema, cyanosis, or clubbing.  NEUROLOGIC: Cranial nerves II through XII are intact. Muscle strength 5/5 in all extremities except left lower leg, limited range of motion due to pain. Sensation intact. Gait not checked.  PSYCHIATRIC: The patient is alert and oriented x 3.  SKIN: No obvious rash, lesion, or ulcer.    LABORATORY PANEL:   CBC Recent Labs  Lab 09/21/18 0428  WBC 11.7*  HGB 10.4*  HCT 30.7*  PLT 216   ------------------------------------------------------------------------------------------------------------------  Chemistries  Recent Labs  Lab 09/21/18 0428  NA 140  K 3.3*  CL 107  CO2 24  GLUCOSE 137*  BUN 45*  CREATININE 2.40*  CALCIUM 8.6*   ------------------------------------------------------------------------------------------------------------------  Cardiac Enzymes No results for input(s): TROPONINI in the last 168 hours. ------------------------------------------------------------------------------------------------------------------  RADIOLOGY:  Dg Chest 1 View  Result Date: 09/19/2018 CLINICAL DATA:  Pain status post fall. EXAM: CHEST  1 VIEW COMPARISON:  Chest x-ray from 2016 could not be retrieved for comparison at the time of this dictation. FINDINGS: The heart size is enlarged. Aortic calcifications are noted. There is no pneumothorax. No large pleural effusion. No focal infiltrate. IMPRESSION: No active disease. Electronically Signed   By: Constance Holster M.D.   On: 09/19/2018 14:45   Dg Finger Middle Right  Result Date: 09/19/2018 CLINICAL DATA:  Pain status post fall EXAM: RIGHT MIDDLE FINGER 2+V COMPARISON:  None. FINDINGS: There is a subtle lucency through the base of the proximal phalanx of the third digit. There are advanced degenerative changes of the distal interphalangeal joint of the third digit.  There is soft tissue swelling. There is some  age-indeterminate subluxation of the DIP. IMPRESSION: 1. Lucency through the base of the proximal phalanx of the third digit may represent a nondisplaced fracture. 2. Advanced degenerative changes of the DIP with some dorsal subluxation of the distal phalanx, which is age indeterminate but favored to be chronic and secondary to the advanced degenerative changes. 3. Soft tissue swelling about the third digit. Electronically Signed   By: Constance Holster M.D.   On: 09/19/2018 14:43   Dg Hip Unilat W Or Wo Pelvis 2-3 Views Left  Result Date: 09/19/2018 CLINICAL DATA:  Pain EXAM: DG HIP (WITH OR WITHOUT PELVIS) 2-3V LEFT COMPARISON:  None. FINDINGS: There is an acute displaced fracture of the left femoral neck. There is no dislocation. There are advanced degenerative changes of both hips. There is diffuse osteopenia. There is some regularity of the pubic rami on the left which may represent old healed fractures. IMPRESSION: Acute displaced fracture of the proximal left femur as above. Electronically Signed   By: Constance Holster M.D.   On: 09/19/2018 14:41    EKG:   Orders placed or performed during the hospital encounter of 09/19/18  . EKG 12-Lead  . EKG 12-Lead    ASSESSMENT AND PLAN:   Eric Russell  is a 63 y.o. male with a known history of hypertension, diabetes, hyperlipidemia and severe sleep apnea where CPAP comes to the emergency room after he fell tripped on a few boxes at work and left hip fracture.  1. Left acute hip fracture/femoral neck fracture status post mechanical fall at work -Appreciate Ortho consult.  Continue pain control -Going for surgery today. -IV fluids, will need physical therapy and DVT prophylaxis after surgery.  Monitor hemoglobin  2. Hypertension  -continue Coreg, amlodipine, lisinopril, hydralazine and Maxizde  3.   CKD stage IV-most recent baseline creatinine as outpatient at 2.4.  Stable at baseline now.   Continue to monitor closely -Low potassium-being replaced.  Patient  on lisinopril but also on hydrochlorothiazide -Monitor closely  4. Type II diabetes -on sliding scale insulin  5. Leukocytosis appears reactive.  Improving now  6. DVT prophylaxis-teds and SCDs only, Lovenox can be started after surgery  7. Severe obstructive sleep apnea. Patient uses CPAP at home.  Continue    All the records are reviewed and case discussed with Care Management/Social Workerr. Management plans discussed with the patient, family and they are in agreement.  CODE STATUS: Full code  TOTAL TIME TAKING CARE OF THIS PATIENT: 37 minutes.   POSSIBLE D/C IN 2-3 DAYS, DEPENDING ON CLINICAL CONDITION.   Gladstone Lighter M.D on 09/21/2018 at 10:19 AM  Between 7am to 6pm - Pager - [phone]  After 6pm go to [email or web address] - password [email or web address] [facility]  Office  [phone]  CC: Primary care physician; Rusty Aus, MD

## 2018-09-21 NOTE — Op Note (Signed)
09/21/2018  1:26 PM  PATIENT:  Eric Russell  63 y.o. male  PRE-OPERATIVE DIAGNOSIS:  LEFT HIP FRACTURE and osteoarthritis  POST-OPERATIVE DIAGNOSIS:  LEFT HIP FRACTURE and osteoarthritis  PROCEDURE:  Procedure(s): TOTAL HIP ARTHROPLASTY ANTERIOR APPROACH (Left)  SURGEON: Laurene Footman, MD  ASSISTANTS: None  ANESTHESIA:   spinal  EBL:  Total I/O In: 400 [I.V.:400] Out: 450 [Urine:400; Blood:50]  BLOOD ADMINISTERED:none  DRAINS: none   LOCAL MEDICATIONS USED:  MARCAINE    and OTHER Exparel  SPECIMEN:  Source of Specimen:  Left femoral head and neck  DISPOSITION OF SPECIMEN:  PATHOLOGY  COUNTS:  YES  TOURNIQUET:  * No tourniquets in log *  IMPLANTS: Medacta AMIS3 standard stem with 52 mm Mpact TM cup and liner with metal S 28 mm head  DICTATION: .Dragon Dictation   The patient was brought to the operating room and after spinal anesthesia was obtained patient was placed on the operative table with the ipsilateral foot into the Medacta attachment, contralateral leg on a well-padded table. C-arm was brought in and preop template x-ray taken. After prepping and draping in usual sterile fashion appropriate patient identification and timeout procedures were completed. Anterior approach to the hip was obtained and centered over the greater trochanter and TFL muscle. The subcutaneous tissue was incised hemostasis being achieved by electrocautery. TFL fascia was incised and the muscle retracted laterally deep retractor placed. The lateral femoral circumflex vessels were identified and ligated. The anterior capsule was exposed and a capsulotomy performed. The neck was identified and a femoral neck cut carried out with a saw. The head was removed without difficulty and showed sclerotic femoral head and acetabulum. Reaming was carried out to 52 mm and a 52 mm cup trial gave appropriate tightness to the acetabular component a 52 DM cup was impacted into position. The leg was then externally  rotated and ischiofemoral and pubofemoral releases carried out. The femur was sequentially broached to a size 3, size 3 standard with S head trials were placed and the final components chosen. The 3 standard stem was inserted along with a metal S 28 mm head and 52 mm liner. The hip was reduced and was stable the wound was thoroughly irrigated with fibrillar placed along the posterior capsule and medial neck. The deep fascia ws closed using a heavy Quill after infiltration of 30 cc of quarter percent Sensorcaine with epinephrine.3-0 V-loc to close the skin with skin staples. Xeroform incisional wound VAC and patient was sent to recovery in stable condition.   PLAN OF CARE: Continue as inpatient

## 2018-09-21 NOTE — Transfer of Care (Signed)
Immediate Anesthesia Transfer of Care Note  Patient: Eric Russell  Procedure(s) Performed: TOTAL HIP ARTHROPLASTY ANTERIOR APPROACH (Left )  Patient Location: PACU  Anesthesia Type:Spinal  Level of Consciousness: awake, alert  and oriented  Airway & Oxygen Therapy: Patient Spontanous Breathing and Patient connected to face mask oxygen  Post-op Assessment: Report given to RN and Post -op Vital signs reviewed and stable  Post vital signs: Reviewed and stable  Last Vitals:  Vitals Value Taken Time  BP    Temp    Pulse    Resp    SpO2      Last Pain:  Vitals:   09/21/18 1113  TempSrc: Tympanic  PainSc:          Complications: No apparent anesthesia complications

## 2018-09-22 ENCOUNTER — Encounter: Payer: Self-pay | Admitting: Orthopedic Surgery

## 2018-09-22 LAB — CBC
HCT: 28.9 % — ABNORMAL LOW (ref 39.0–52.0)
Hemoglobin: 9.7 g/dL — ABNORMAL LOW (ref 13.0–17.0)
MCH: 29.8 pg (ref 26.0–34.0)
MCHC: 33.6 g/dL (ref 30.0–36.0)
MCV: 88.9 fL (ref 80.0–100.0)
Platelets: 191 10*3/uL (ref 150–400)
RBC: 3.25 MIL/uL — ABNORMAL LOW (ref 4.22–5.81)
RDW: 12.8 % (ref 11.5–15.5)
WBC: 11.7 10*3/uL — ABNORMAL HIGH (ref 4.0–10.5)
nRBC: 0 % (ref 0.0–0.2)

## 2018-09-22 LAB — GLUCOSE, CAPILLARY
Glucose-Capillary: 108 mg/dL — ABNORMAL HIGH (ref 70–99)
Glucose-Capillary: 112 mg/dL — ABNORMAL HIGH (ref 70–99)
Glucose-Capillary: 110 mg/dL — ABNORMAL HIGH (ref 70–99)
Glucose-Capillary: 122 mg/dL — ABNORMAL HIGH (ref 70–99)

## 2018-09-22 LAB — BASIC METABOLIC PANEL
Anion gap: 9 (ref 5–15)
BUN: 49 mg/dL — ABNORMAL HIGH (ref 8–23)
CO2: 26 mmol/L (ref 22–32)
Calcium: 8.6 mg/dL — ABNORMAL LOW (ref 8.9–10.3)
Chloride: 105 mmol/L (ref 98–111)
Creatinine, Ser: 2.53 mg/dL — ABNORMAL HIGH (ref 0.61–1.24)
GFR calc Af Amer: 30 mL/min — ABNORMAL LOW (ref >60)
GFR calc non Af Amer: 26 mL/min — ABNORMAL LOW (ref >60)
Glucose, Bld: 127 mg/dL — ABNORMAL HIGH (ref 70–99)
Potassium: 3.9 mmol/L (ref 3.5–5.1)
Sodium: 140 mmol/L (ref 135–145)

## 2018-09-22 NOTE — TOC Progression Note (Signed)
Transition of Care [facility]) - Progression Note    Patient Details  Name: MAKALE PINDELL MRN: [id number] Date of Birth: [birth year]  Transition of Care [facility]) CM/SW Contact  Jonny Dearden, Lenice Llamas Phone Number: [phone]  09/22/2018, 3:36 PM  Clinical Narrative: Clinical Social Worker ([place]) contacted the following home health agencies to review the referral.  [facility]: declined referral [facility]: declined referral [facility]: declined referral [facility]: declined referral [facility]: declined referral [facility]: declined referral [facility]: Left voicemail  CSW contacted [employer] home health representative and she said Janeece Riggers can accept the patient for home health PT and will start PT Tuesday 6/23. CSW gave Lauren worker's comp adjuster information. Per Charlynne Cousins DME agency representative they will coordinate hospital bed delivery with patient's wife [name]. Doniela worker's comp Engineer, structural is aware of above. Patient is aware of above. CSW contacted patient's wife [name] and made her aware of above. CSW will continue to follow and assist as needed.           Expected Discharge Plan and Services                                                 Social Determinants of Health (SDOH) Interventions    Readmission Risk Interventions No flowsheet data found.

## 2018-09-22 NOTE — Progress Notes (Signed)
OT Cancellation Note  Patient Details Name: ERAN MISTRY MRN: [id number] DOB: 11-03-1955   Cancelled Treatment:    Reason Eval/Treat Not Completed: Other (comment). Order received, chart reviewed. Pt with nursing for pt care and medications. Will re-attempt OT evaluation at later date/time as pt is available and medically appropriate.  Jeni Salles, MPH, MS, OTR/L ascom [phone] 09/22/18, 11:10 AM

## 2018-09-22 NOTE — Progress Notes (Signed)
[place] at [place] NAME: Eric Russell    MR#:  [id number]  DATE OF BIRTH:  1956-01-26  SUBJECTIVE:  CHIEF COMPLAINT:   Chief Complaint  Patient presents with  . Fall   -Patient is only postop day 1 after his left hip surgery.  Immense progress with physical therapy and has been ambulating around nurses station with minimal pain. -Slight drop in hemoglobin noted  REVIEW OF SYSTEMS:  Review of Systems  Constitutional: Negative for chills, fever and malaise/fatigue.  HENT: Negative for ear discharge, hearing loss and nosebleeds.   Eyes: Negative for blurred vision and double vision.  Respiratory: Negative for cough, shortness of breath and wheezing.   Cardiovascular: Negative for chest pain, palpitations and leg swelling.  Gastrointestinal: Negative for abdominal pain, constipation, diarrhea, nausea and vomiting.  Genitourinary: Negative for dysuria.  Musculoskeletal: Positive for joint pain and myalgias.  Neurological: Negative for dizziness, focal weakness, seizures, weakness and headaches.  Psychiatric/Behavioral: Negative for depression.    DRUG ALLERGIES:  No Known Allergies  VITALS:  Blood pressure 112/61, pulse 68, temperature 99.5 F (37.5 C), temperature source Oral, resp. rate 17, height 5\' 11"  (1.803 m), weight 106.6 kg, SpO2 96 %.  PHYSICAL EXAMINATION:  Physical Exam   GENERAL:  63 y.o.-year-old patient lying in the bed with no acute distress.  EYES: Pupils equal, round, reactive to light and accommodation. No scleral icterus. Extraocular muscles intact.  HEENT: Head atraumatic, normocephalic. Oropharynx and nasopharynx clear.  NECK:  Supple, no jugular venous distention. No thyroid enlargement, no tenderness.  LUNGS: Normal breath sounds bilaterally, no wheezing, rales,rhonchi or crepitation. No use of accessory muscles of respiration.  CARDIOVASCULAR: S1, S2 normal. No murmurs, rubs, or gallops.  ABDOMEN: Soft,  nontender, nondistended. Bowel sounds present. No organomegaly or mass.  EXTREMITIES: Left hip  dressing in place..  No pedal edema, cyanosis, or clubbing.  NEUROLOGIC: Cranial nerves II through XII are intact. Muscle strength 5/5 in all extremities except left lower leg, limited range of motion due to pain. Sensation intact. Gait not checked.  PSYCHIATRIC: The patient is alert and oriented x 3.  SKIN: No obvious rash, lesion, or ulcer.    LABORATORY PANEL:   CBC Recent Labs  Lab 09/22/18 0615  WBC 11.7*  HGB 9.7*  HCT 28.9*  PLT 191   ------------------------------------------------------------------------------------------------------------------  Chemistries  Recent Labs  Lab 09/22/18 0615  NA 140  K 3.9  CL 105  CO2 26  GLUCOSE 127*  BUN 49*  CREATININE 2.53*  CALCIUM 8.6*   ------------------------------------------------------------------------------------------------------------------  Cardiac Enzymes No results for input(s): TROPONINI in the last 168 hours. ------------------------------------------------------------------------------------------------------------------  RADIOLOGY:  Dg Hip Operative Unilat W Or W/o Pelvis Left  Result Date: 09/21/2018 CLINICAL DATA:  LEFT hip arthroplasty EXAM: OPERATIVE LEFT HIP (WITH PELVIS IF PERFORMED) 6 VIEWS TECHNIQUE: Fluoroscopic spot image(s) were submitted for interpretation post-operatively. COMPARISON:  09/19/2018 radiographs FINDINGS: Intraoperative spot views of the LEFT hip demonstrate interval LEFT total hip arthroplasty without gross complicating features. IMPRESSION: LEFT total hip arthroplasty Electronically Signed   By: Margarette Canada M.D.   On: 09/21/2018 13:50   Dg Hip Unilat W Or W/o Pelvis 2-3 Views Left  Result Date: 09/21/2018 CLINICAL DATA:  Postop left hip arthroplasty. EXAM: DG HIP (WITH OR WITHOUT PELVIS) 2-3V LEFT COMPARISON:  09/19/2018 FINDINGS: Left hip arthroplasty appears well seated and aligned.  No acute fracture or evidence of an operative complication. IMPRESSION: Well-positioned left hip arthroplasty Electronically Signed  By: Lajean Manes M.D.   On: 09/21/2018 14:21    EKG:   Orders placed or performed during the hospital encounter of 09/19/18  . EKG 12-Lead  . EKG 12-Lead    ASSESSMENT AND PLAN:   Eric Russell  is a 63 y.o. male with a known history of hypertension, diabetes, hyperlipidemia and severe sleep apnea where CPAP comes to the emergency room after he fell tripped on a few boxes at work and left hip fracture.  1. Left acute hip fracture/femoral neck fracture status post mechanical fall at work -Appreciate Ortho consult.  Continue pain control -Postop day 1 today -Doing well with physical therapy.  Slight drop in hemoglobin noted but no need for transfusion. -Plan to discharge home with home health tomorrow  2. Hypertension  -continue Coreg, amlodipine, lisinopril, hydralazine and Maxizde  3.   CKD stage IV-most recent baseline creatinine as outpatient at 2.4.  Stable at baseline now.  Continue to monitor closely -Low potassium-replaced.    -Monitor closely  4. Type II diabetes -on sliding scale insulin  5. Leukocytosis appears reactive.  Improving now.  Urine analysis with no infection  6. DVT prophylaxis-teds and SCDs only, Lovenox started  7. Severe obstructive sleep apnea. Patient uses CPAP at home.  Continue  Updated wife over the phone this morning   All the records are reviewed and case discussed with Care Management/Social Workerr. Management plans discussed with the patient, family and they are in agreement.  CODE STATUS: Full code  TOTAL TIME TAKING CARE OF THIS PATIENT: 37 minutes.   POSSIBLE D/C tomorrow, DEPENDING ON CLINICAL CONDITION.   Gladstone Lighter M.D on 09/22/2018 at 10:22 AM  Between 7am to 6pm - Pager - [phone]  After 6pm go to [email or web address] - password [email or web address] [facility]  Office   [phone]  CC: Primary care physician; Rusty Aus, MD

## 2018-09-22 NOTE — Progress Notes (Signed)
Patient has Hip Fracture and severe Sleep Apnea which requires Hip and legs and upper body to be positioned in ways  not feasible with a normal bed. Head must be elevated at least  30 or more to help alleviate pain and to aide in breathing

## 2018-09-22 NOTE — Progress Notes (Signed)
   Subjective: 1 Day Post-Op Procedure(s) (LRB): TOTAL HIP ARTHROPLASTY ANTERIOR APPROACH (Left) Patient reports pain as mild.   Patient is well, and has had no acute complaints or problems Denies any CP, SOB, ABD pain. We will continue therapy today.  Plan is to go Home after hospital stay.  Objective: Vital signs in last 24 hours: Temp:  [97.5 F (36.4 C)-99.5 F (37.5 C)] 99.5 F (37.5 C) (06/19 0802) Pulse Rate:  [51-68] 68 (06/19 0802) Resp:  [13-18] 17 (06/19 0802) BP: (112-175)/(61-82) 112/61 (06/19 0802) SpO2:  [93 %-98 %] 96 % (06/19 0802)  Intake/Output from previous day: 06/18 0701 - 06/19 0700 In: 2372.7 [P.O.:480; I.V.:1692.6; IV Piggyback:200.1] Out: 2300 [Urine:2250; Blood:50] Intake/Output this shift: No intake/output data recorded.  Recent Labs    09/19/18 1455 09/21/18 0428 09/22/18 0615  HGB 10.6* 10.4* 9.7*   Recent Labs    09/21/18 0428 09/22/18 0615  WBC 11.7* 11.7*  RBC 3.50* 3.25*  HCT 30.7* 28.9*  PLT 216 191   Recent Labs    09/21/18 0428 09/22/18 0615  NA 140 140  K 3.3* 3.9  CL 107 105  CO2 24 26  BUN 45* 49*  CREATININE 2.40* 2.53*  GLUCOSE 137* 127*  CALCIUM 8.6* 8.6*   Recent Labs    09/19/18 1455  INR 1.1    EXAM General - Patient is Alert, Appropriate and Oriented Extremity - Sensation intact distally Intact pulses distally Dorsiflexion/Plantar flexion intact No cellulitis present Compartment soft Dressing - dressing C/D/I and no drainage Motor Function - intact, moving foot and toes well on exam.   Past Medical History:  Diagnosis Date  . Arthritis   . Cancer [facility])    prostate  . Diabetes mellitus without complication ([place])   . Hypertension   . Sleep apnea     Assessment/Plan:   1 Day Post-Op Procedure(s) (LRB): TOTAL HIP ARTHROPLASTY ANTERIOR APPROACH (Left) Active Problems:   Hip fracture (H[facility])  Estimated body mass index is 32.78 kg/m as calculated from the following:   Height as of this  encounter: 5\' 11"  (1.803 m).   Weight as of this encounter: 106.6 kg. Advance diet Up with therapy  Needs BM Acute post op blood loss anemia - Hgb 9.7. Recheck labs in the am.  VSS CM to assist with discharge.   DVT Prophylaxis - Lovenox, TED hose and SCDs Weight-Bearing as tolerated to left leg   T. Rachelle Hora, PA-C [place] 09/22/2018, 8:20 AM

## 2018-09-22 NOTE — Progress Notes (Signed)
Physical Therapy Treatment Patient Details Name: Eric Russell MRN: [id number] DOB: March 26, 1956 Today's Date: 09/22/2018    History of Present Illness presented to ER after mechanical fall in home environment, acute onset of L hip pain; admitted with L femoral neck fracture, s/p L THA (anterior approach, WBAT).  Also noted with R 3rd digit fracture (base of proximal phalanx); no treatment required per MD    PT Comments    Patient continues with good progress towards all mobility goals, completing full lap around nursing station and completing stair negotiation (up/down 4 with bilat rails, cga/close sup) without difficulty.  Slow and guarded at times, but improving comfort/confidence with mobility tasks. Pain well-controlled and patient with excellent efforts at all times.    Follow Up Recommendations  Home health PT     Equipment Recommendations  Hospital bed    Recommendations for Other Services       Precautions / Restrictions Precautions Precautions: Fall;Anterior Hip Restrictions Weight Bearing Restrictions: Yes LLE Weight Bearing: Weight bearing as tolerated    Mobility  Bed Mobility Overal bed mobility: Needs Assistance Bed Mobility: Sit to Supine       Sit to supine: Min assist   General bed mobility comments: assist for L LE elevation over edge of bed  Transfers Overall transfer level: Needs assistance Equipment used: Rolling walker (2 wheeled) Transfers: Sit to/from Stand Sit to Stand: Supervision         General transfer comment: increased time, but good safety/stability  Ambulation/Gait Ambulation/Gait assistance: Supervision Gait Distance (Feet): 200 Feet Assistive device: Rolling walker (2 wheeled)       General Gait Details: improving fluidity and symmetry of gait pattern; improving comfort/confidence with gait efforts   Stairs Stairs: Yes Stairs assistance: Min guard;Supervision Stair Management: Two rails Number of Stairs: 4 General  stair comments: min cuing for sequence/technique; good stability L LE   Wheelchair Mobility    Modified Rankin (Stroke Patients Only)       Balance Overall balance assessment: Needs assistance Sitting-balance support: No upper extremity supported;Feet supported Sitting balance-Leahy Scale: Good     Standing balance support: Bilateral upper extremity supported Standing balance-Leahy Scale: Fair                              Cognition Arousal/Alertness: Awake/alert Behavior During Therapy: WFL for tasks assessed/performed Overall Cognitive Status: Within Functional Limits for tasks assessed                                        Exercises Other Exercises Other Exercises: Verbally reviewed technique for car transfer; patient voiced understanding. Other Exercises: Issued handout with supine LE therex for use as HEP; encouraged performance before bed and 2-3x/day at discharge.  Patient voiced understanding.    General Comments        Pertinent Vitals/Pain Pain Assessment: 0-10 Pain Score: 2  Pain Location: L hip Pain Descriptors / Indicators: Aching;Guarding Pain Intervention(s): Limited activity within patient's tolerance;Monitored during session;Repositioned    Home Living Family/patient expects to be discharged to:: Private residence Living Arrangements: Spouse/significant other Available Help at Discharge: Family;Available 24 hours/day Type of Home: House Home Access: Level entry   Home Layout: Two level;Bed/bath upstairs;1/2 bath on main level Home Equipment: Walker - 2 wheels;Cane - single point;Adaptive equipment      Prior Function Level of Independence: Independent  Comments: Indep with ADLs, household and community mobilization without assist device; working at [employer] (current goals can now be found in the care plan section) Acute Rehab PT Goals Patient Stated Goal: return to PLOF PT Goal Formulation: With  patient Time For Goal Achievement: 10/05/18 Potential to Achieve Goals: Good Progress towards PT goals: Progressing toward goals    Frequency    BID      PT Plan Current plan remains appropriate    Co-evaluation              AM-PAC PT "6 Clicks" Mobility   Outcome Measure  Help needed turning from your back to your side while in a flat bed without using bedrails?: None Help needed moving from lying on your back to sitting on the side of a flat bed without using bedrails?: None Help needed moving to and from a bed to a chair (including a wheelchair)?: None Help needed standing up from a chair using your arms (e.g., wheelchair or bedside chair)?: None Help needed to walk in hospital room?: A Little Help needed climbing 3-5 steps with a railing? : A Little 6 Click Score: 22    End of Session Equipment Utilized During Treatment: Gait belt Activity Tolerance: Patient tolerated treatment well Patient left: in bed;with bed alarm set;with call bell/phone within reach Nurse Communication: Mobility status PT Visit Diagnosis: Muscle weakness (generalized) (M62.81);Difficulty in walking, not elsewhere classified (R26.2)     Time: 1324-4010 PT Time Calculation (min) (ACUTE ONLY): 23 min  Charges:  $Gait Training: 23-37 mins                     Othar Curto H. Owens Shark, PT, DPT, NCS 09/22/18, 3:33 PM [phone]

## 2018-09-22 NOTE — TOC Progression Note (Signed)
Transition of Care [facility]) - Progression Note    Patient Details  Name: Eric Russell MRN: [id number] Date of Birth: 08/19/1955  Transition of Care [facility] CM/SW Contact  Khadeem Rockett, Lenice Llamas Phone Number: [phone]  09/22/2018, 12:16 PM  Clinical Narrative: PT is recommending home health and patient's wife is requesting a hospital bed. Clinical Education officer, museum (CSW) notified [employer] agency representative the request for the hospital bed. CSW asked [place] representative to review referral for home health. CSW contacted worker's comp Barrister's clerk and made her aware of above. CSW will continue to follow and assist as needed.              Expected Discharge Plan and Services                                                 Social Determinants of Health (SDOH) Interventions    Readmission Risk Interventions No flowsheet data found.

## 2018-09-22 NOTE — Anesthesia Postprocedure Evaluation (Signed)
Anesthesia Post Note  Patient: Eric Russell  Procedure(s) Performed: TOTAL HIP ARTHROPLASTY ANTERIOR APPROACH (Left )  Patient location during evaluation: Nursing Unit Anesthesia Type: Spinal Level of consciousness: awake, awake and alert and oriented Pain management: pain level controlled Vital Signs Assessment: post-procedure vital signs reviewed and stable Respiratory status: spontaneous breathing Cardiovascular status: blood pressure returned to baseline Postop Assessment: no headache Anesthetic complications: no     Last Vitals:  Vitals:   09/22/18 0359 09/22/18 0802  BP: (!) 154/74 112/61  Pulse: 62 68  Resp: 18 17  Temp: 36.8 C 37.5 C  SpO2: 93% 96%    Last Pain:  Vitals:   09/22/18 0802  TempSrc: Oral  PainSc:                  Buckner [place]

## 2018-09-22 NOTE — Progress Notes (Signed)
Physical Therapy Treatment Patient Details Name: Eric Russell MRN: [id number] DOB: Mar 18, 1956 Today's Date: 09/22/2018    History of Present Illness presented to ER after mechanical fall in home environment, acute onset of L hip pain; admitted with L femoral neck fracture, s/p L THA (anterior approach, WBAT).  Also noted with R 3rd digit fracture (base of proximal phalanx); no treatment required per MD    PT Comments    Able to complete full lap around nursing station (200') with RW, cga/close sup; slow, but steady, cadence with min cuing for L LE heel strike/toe off, postural extension.  10' walk time, 11-12 seconds; no buckling or LOB. Will plan to initiate stairs and issue HEP this PM.  Did review current status/progress with wife per phone (per patient request); wife pleased with progress, requesting hospital bed for home use at discharge (no bed on main living floor).  TOC team made aware.    Follow Up Recommendations  Home health PT     Equipment Recommendations  Hospital bed(has RW, [facility])    Recommendations for Other Services       Precautions / Restrictions Precautions Precautions: Fall;Anterior Hip Restrictions Weight Bearing Restrictions: Yes LLE Weight Bearing: Weight bearing as tolerated    Mobility  Bed Mobility Overal bed mobility: Modified Independent Bed Mobility: Supine to Sit              Transfers Overall transfer level: Needs assistance Equipment used: Rolling walker (2 wheeled) Transfers: Sit to/from Stand Sit to Stand: Supervision         General transfer comment: heavy use of UEs to assist with lift off, but able to complete without external assist given increased time  Ambulation/Gait Ambulation/Gait assistance: Supervision Gait Distance (Feet): 200 Feet Assistive device: Rolling walker (2 wheeled)   Gait velocity: 10' walk time, 11-12 seconds   General Gait Details: reciprocal stepping pattern; slow, but steady, cadence and  overall gait performance. Cuing for L heel strike/toe off, postural extension and increased cadence as able.   Stairs             Wheelchair Mobility    Modified Rankin (Stroke Patients Only)       Balance Overall balance assessment: Needs assistance Sitting-balance support: No upper extremity supported;Feet supported Sitting balance-Leahy Scale: Good     Standing balance support: Bilateral upper extremity supported Standing balance-Leahy Scale: Fair                              Cognition Arousal/Alertness: Awake/alert Behavior During Therapy: WFL for tasks assessed/performed Overall Cognitive Status: Within Functional Limits for tasks assessed                                        Exercises Other Exercises Other Exercises: Seated LE therex, 1x15, AROM for muscular strength/flexibility: ankle pumps, LAQs, marching, iso hip adduct.  Will issue handout for HEP this PM    General Comments        Pertinent Vitals/Pain Pain Assessment: 0-10 Pain Score: 2  Pain Location: L hip Pain Descriptors / Indicators: Aching;Guarding Pain Intervention(s): Limited activity within patient's tolerance;Monitored during session;Premedicated before session;Repositioned    Home Living                      Prior Function  PT Goals (current goals can now be found in the care plan section) Acute Rehab PT Goals Patient Stated Goal: to return home at discharge PT Goal Formulation: With patient Time For Goal Achievement: 10/05/18 Potential to Achieve Goals: Good    Frequency    BID      PT Plan Current plan remains appropriate    Co-evaluation              AM-PAC PT "6 Clicks" Mobility   Outcome Measure  Help needed turning from your back to your side while in a flat bed without using bedrails?: None Help needed moving from lying on your back to sitting on the side of a flat bed without using bedrails?: None Help  needed moving to and from a bed to a chair (including a wheelchair)?: None Help needed standing up from a chair using your arms (e.g., wheelchair or bedside chair)?: None Help needed to walk in hospital room?: A Little Help needed climbing 3-5 steps with a railing? : A Little 6 Click Score: 22    End of Session Equipment Utilized During Treatment: Gait belt Activity Tolerance: Patient tolerated treatment well Patient left: in chair;with chair alarm set;with call bell/phone within reach Nurse Communication: Mobility status PT Visit Diagnosis: Muscle weakness (generalized) (M62.81);Difficulty in walking, not elsewhere classified (R26.2)     Time: 5170-0174 PT Time Calculation (min) (ACUTE ONLY): 33 min  Charges:  $Gait Training: 8-22 mins $Therapeutic Exercise: 8-22 mins                     Tyrelle Raczka H. Owens Shark, PT, DPT, NCS 09/22/18, 9:42 AM [phone]

## 2018-09-22 NOTE — Evaluation (Signed)
Occupational Therapy Evaluation Patient Details Name: Eric Russell MRN: [id number] DOB: 1956/03/16 Today's Date: 09/22/2018    History of Present Illness presented to ER after mechanical fall in home environment, acute onset of L hip pain; admitted with L femoral neck fracture, s/p L THA (anterior approach, WBAT).  Also noted with R 3rd digit fracture (base of proximal phalanx); no treatment required per MD   Clinical Impression   Pt seen for OT evaluation this date, POD#1 from above surgery. 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 Pt is eager to return to PLOF with less pain and improved safety and independence. Pt reports minimal pain (1-2/10) in L hip and demonstrates impairments in strength in L hip. Pt able to perform ADL tasks requiring PRN Min A for LB dressing due to L hip pain. Pt instructed in self care skills, falls prevention strategies including pet care considerations, home/routines modifications, DME/AE for LB bathing and dressing tasks, and compression stocking mgt strategies. Pt verbalized understanding and also able to recall some instruction from previous knee surgery in 2019. Do not anticipate additional need for skilled OT services at this time. Will sign off. Please re-consult if additional needs arise.     Follow Up Recommendations  No OT follow up    Equipment Recommendations  None recommended by OT    Recommendations for Other Services       Precautions / Restrictions Precautions Precautions: Fall;Anterior Hip Restrictions Weight Bearing Restrictions: Yes LLE Weight Bearing: Weight bearing as tolerated      Mobility Bed Mobility         General bed mobility comments: deferred, up in recliner; was Mod indep with PT  Transfers Overall transfer level: Needs assistance Equipment used: Rolling walker (2 wheeled) Transfers: Sit to/from Stand Sit to Stand:  Supervision         General transfer comment: heavy use of UEs to assist with lift off, but able to complete without external assist given increased time    Balance Overall balance assessment: Needs assistance Sitting-balance support: No upper extremity supported;Feet supported Sitting balance-Leahy Scale: Good     Standing balance support: Bilateral upper extremity supported Standing balance-Leahy Scale: Fair                             ADL either performed or assessed with clinical judgement   ADL Overall ADL's : Needs assistance/impaired                                       General ADL Comments: Min A for LB ADL, Max A for compression stockings; spouse able to provide     Vision Baseline Vision/History: Wears glasses Wears Glasses: At all times Patient Visual Report: No change from baseline       Perception     Praxis      Pertinent Vitals/Pain Pain Assessment: 0-10 Pain Score: 2  Pain Location: L hip Pain Descriptors / Indicators: Aching;Guarding Pain Intervention(s): Limited activity within patient's tolerance;Monitored during session;Ice applied;Repositioned     Hand Dominance Right   Extremity/Trunk Assessment Upper Extremity Assessment Upper Extremity Assessment: Overall WFL for tasks assessed   Lower Extremity Assessment Lower Extremity Assessment: LLE deficits/detail LLE Deficits / Details: L LE grossly at  least 3-/5; good isolated strength and ROM; full sensory return. R LE Grossly WFL       Communication Communication Communication: No difficulties   Cognition Arousal/Alertness: Awake/alert Behavior During Therapy: WFL for tasks assessed/performed Overall Cognitive Status: Within Functional Limits for tasks assessed                                     General Comments       Exercises Other Exercises Other Exercises: pt instructed in falls prevention including pet care considerations, car  transfers, AE/DME, home/routines modifications, and RW use for ADL   Shoulder Instructions      Home Living Family/patient expects to be discharged to:: Private residence Living Arrangements: Spouse/significant other Available Help at Discharge: Family;Available 24 hours/day Type of Home: House Home Access: Level entry     Home Layout: Two level;Bed/bath upstairs;1/2 bath on main level Alternate Level Stairs-Number of Steps: 8+8 (landing) Alternate Level Stairs-Rails: Right;Left Bathroom Shower/Tub: Tub/shower unit   Bathroom Toilet: Handicapped height(with toilet riser)     Home Equipment: Environmental consultant - 2 wheels;Cane - single point;Adaptive equipment Adaptive Equipment: Reacher;Sock aid        Prior Functioning/Environment Level of Independence: Independent        Comments: Indep with ADLs, household and community mobilization without assist device; working at [employer]        OT Problem List: Decreased strength;Pain      OT Treatment/Interventions:      OT Goals(Current goals can be found in the care plan section) Acute Rehab OT Goals Patient Stated Goal: return to PLOF OT Goal Formulation: All assessment and education complete, DC therapy  OT Frequency:     Barriers to D/C:            Co-evaluation              AM-PAC OT "6 Clicks" Daily Activity     Outcome Measure Help from another person eating meals?: None Help from another person taking care of personal grooming?: None Help from another person toileting, which includes using toliet, bedpan, or urinal?: A Little Help from another person bathing (including washing, rinsing, drying)?: A Little Help from another person to put on and taking off regular upper body clothing?: None Help from another person to put on and taking off regular lower body clothing?: A Little 6 Click Score: 21   End of Session    Activity Tolerance: Patient tolerated treatment well Patient left: in chair;with call bell/phone  within reach;with chair alarm set  OT Visit Diagnosis: Other abnormalities of gait and mobility (R26.89);Pain Pain - Right/Left: Left Pain - part of body: Hip                Time: 5809-9833 OT Time Calculation (min): 20 min Charges:  OT General Charges $OT Visit: 1 Visit OT Evaluation $OT Eval Low Complexity: 1 Low OT Treatments $Self Care/Home Management : 8-22 mins  Jeni Salles, MPH, MS, OTR/L ascom [phone] 09/22/18, 11:52 AM

## 2018-09-23 LAB — CBC
HCT: 27.4 % — ABNORMAL LOW (ref 39.0–52.0)
Hemoglobin: 9.1 g/dL — ABNORMAL LOW (ref 13.0–17.0)
MCH: 29.9 pg (ref 26.0–34.0)
MCHC: 33.2 g/dL (ref 30.0–36.0)
MCV: 90.1 fL (ref 80.0–100.0)
Platelets: 183 10*3/uL (ref 150–400)
RBC: 3.04 MIL/uL — ABNORMAL LOW (ref 4.22–5.81)
RDW: 12.8 % (ref 11.5–15.5)
WBC: 11 10*3/uL — ABNORMAL HIGH (ref 4.0–10.5)
nRBC: 0 % (ref 0.0–0.2)

## 2018-09-23 LAB — BASIC METABOLIC PANEL
Anion gap: 9 (ref 5–15)
BUN: 61 mg/dL — ABNORMAL HIGH (ref 8–23)
CO2: 26 mmol/L (ref 22–32)
Calcium: 8.7 mg/dL — ABNORMAL LOW (ref 8.9–10.3)
Chloride: 104 mmol/L (ref 98–111)
Creatinine, Ser: 2.89 mg/dL — ABNORMAL HIGH (ref 0.61–1.24)
GFR calc Af Amer: 26 mL/min — ABNORMAL LOW (ref >60)
GFR calc non Af Amer: 22 mL/min — ABNORMAL LOW (ref >60)
Glucose, Bld: 123 mg/dL — ABNORMAL HIGH (ref 70–99)
Potassium: 4 mmol/L (ref 3.5–5.1)
Sodium: 139 mmol/L (ref 135–145)

## 2018-09-23 LAB — GLUCOSE, CAPILLARY
Glucose-Capillary: 95 mg/dL (ref 70–99)
Glucose-Capillary: 101 mg/dL — ABNORMAL HIGH (ref 70–99)
Glucose-Capillary: 123 mg/dL — ABNORMAL HIGH (ref 70–99)

## 2018-09-23 MED ORDER — HYDROCODONE-ACETAMINOPHEN 5-325 MG PO TABS: 1.0000 | ORAL_TABLET | ORAL | 0 refills | Status: DC | PRN

## 2018-09-23 MED ORDER — DOCUSATE SODIUM 100 MG PO CAPS: 100.0000 mg | ORAL_CAPSULE | Freq: Two times a day (BID) | ORAL | 0 refills | Status: DC

## 2018-09-23 MED ORDER — ENOXAPARIN SODIUM 40 MG/0.4ML ~~LOC~~ SOLN: 40.0000 mg | SUBCUTANEOUS | 0 refills | Status: DC

## 2018-09-23 MED ORDER — TRAMADOL HCL 50 MG PO TABS: 50.0000 mg | ORAL_TABLET | Freq: Four times a day (QID) | ORAL | 1 refills | Status: DC

## 2018-09-23 NOTE — Progress Notes (Signed)
Patient discharging home. Instructions and prescriptions given to patient, verbalized understanding. IV removed. Patient waiting on wife to come transport home.

## 2018-09-23 NOTE — Progress Notes (Signed)
Physical Therapy Treatment Patient Details Name: Eric Russell MRN: [id number] DOB: May 20, 1955 Today's Date: 09/23/2018    History of Present Illness presented to ER after mechanical fall in home environment, acute onset of L hip pain; admitted with L femoral neck fracture, s/p L THA (anterior approach, WBAT).  Also noted with R 3rd digit fracture (base of proximal phalanx); no treatment required per MD    PT Comments    Pt awake, agreeable to PT reported no pain in hip at rest, 2/10 with ambulation. Several bed exercises performed, verbal and visual cues needed for exercise form/techinque, AROM to perform. Supine to sit with minA for LLE assist. Pt demonstrated good balance in sitting, and fair balance in standing. Pt ambulated ~237ft with RW and supervision and utilized commode during session. Improved step length and weight bearing on LLE this session. Pt up in chair at end of session with all needs in reach. The patient would benefit from continued skilled PT intervention to continue to progress towards goals.    Follow Up Recommendations  Home health PT     Equipment Recommendations  Hospital bed    Recommendations for Other Services       Precautions / Restrictions Precautions Precautions: Fall;Anterior Hip Restrictions Weight Bearing Restrictions: Yes LLE Weight Bearing: Weight bearing as tolerated    Mobility  Bed Mobility Overal bed mobility: Needs Assistance Bed Mobility: Sit to Supine     Supine to sit: Min assist     General bed mobility comments: Very little physical assist needed to help LLE slide towards EOB  Transfers Overall transfer level: Needs assistance Equipment used: Rolling walker (2 wheeled) Transfers: Sit to/from Stand Sit to Stand: Supervision         General transfer comment: increased time, but good safety/stability  Ambulation/Gait Ambulation/Gait assistance: Supervision Gait Distance (Feet): 210 Feet Assistive device: Rolling  walker (2 wheeled)       General Gait Details: Pt with improved step length, decreased UE effort, no standing rest breaks needed.   Stairs             Wheelchair Mobility    Modified Rankin (Stroke Patients Only)       Balance Overall balance assessment: Needs assistance Sitting-balance support: No upper extremity supported;Feet supported Sitting balance-Leahy Scale: Good     Standing balance support: Bilateral upper extremity supported Standing balance-Leahy Scale: Fair Standing balance comment: Pt able to utilize commode in standing without UE support                            Cognition Arousal/Alertness: Awake/alert Behavior During Therapy: WFL for tasks assessed/performed Overall Cognitive Status: Within Functional Limits for tasks assessed                                        Exercises Total Joint Exercises Ankle Circles/Pumps: AROM;10 reps;Both Quad Sets: AROM;Left;10 reps;Strengthening Short Arc Quad: AROM;Left;10 reps;Strengthening Heel Slides: AROM;Left;10 reps;Strengthening    General Comments        Pertinent Vitals/Pain Pain Assessment: 0-10 Pain Score: 2  Pain Location: L hip Pain Descriptors / Indicators: Aching;Guarding Pain Intervention(s): Limited activity within patient's tolerance;Monitored during session;Repositioned    Home Living                      Prior Function  PT Goals (current goals can now be found in the care plan section) Progress towards PT goals: Progressing toward goals    Frequency    BID      PT Plan Current plan remains appropriate    Co-evaluation              AM-PAC PT "6 Clicks" Mobility   Outcome Measure  Help needed turning from your back to your side while in a flat bed without using bedrails?: None Help needed moving from lying on your back to sitting on the side of a flat bed without using bedrails?: None Help needed moving to and  from a bed to a chair (including a wheelchair)?: None Help needed standing up from a chair using your arms (e.g., wheelchair or bedside chair)?: A Little Help needed to walk in hospital room?: A Little Help needed climbing 3-5 steps with a railing? : A Little 6 Click Score: 21    End of Session Equipment Utilized During Treatment: Gait belt Activity Tolerance: Patient tolerated treatment well Patient left: in chair;with call bell/phone within reach;with chair alarm set Nurse Communication: Mobility status PT Visit Diagnosis: Muscle weakness (generalized) (M62.81);Difficulty in walking, not elsewhere classified (R26.2)     Time: 7858-8502 PT Time Calculation (min) (ACUTE ONLY): 28 min  Charges:  $Therapeutic Exercise: 8-22 mins $Therapeutic Activity: 8-22 mins                    Lieutenant Diego PT, DPT 10:52 AM,09/23/18 [phone]

## 2018-09-23 NOTE — Plan of Care (Signed)

## 2018-09-23 NOTE — Progress Notes (Signed)
Patient's wound vac changed to prevena.

## 2018-09-23 NOTE — Progress Notes (Signed)
   Subjective: 2 Days Post-Op Procedure(s) (LRB): TOTAL HIP ARTHROPLASTY ANTERIOR APPROACH (Left) Patient reports pain as mild.   Patient is well, and has had no acute complaints or problems Denies any CP, SOB, ABD pain. We will continue therapy today.  Plan is to go Home after hospital stay.  Objective: Vital signs in last 24 hours: Temp:  [98 F (36.7 C)-99.5 F (37.5 C)] 98 F (36.7 C) (06/19 2252) Pulse Rate:  [54-68] 62 (06/19 2252) Resp:  [17-18] 18 (06/19 2252) BP: (112-149)/(61-74) 149/70 (06/19 2252) SpO2:  [94 %-96 %] 94 % (06/19 2252)  Intake/Output from previous day: 06/19 0701 - 06/20 0700 In: 850.8 [P.O.:720; I.V.:130.8] Out: 1100 [Urine:1100] Intake/Output this shift: No intake/output data recorded.  Recent Labs    09/21/18 0428 09/22/18 0615 09/23/18 0434  HGB 10.4* 9.7* 9.1*   Recent Labs    09/22/18 0615 09/23/18 0434  WBC 11.7* 11.0*  RBC 3.25* 3.04*  HCT 28.9* 27.4*  PLT 191 183   Recent Labs    09/22/18 0615 09/23/18 0434  NA 140 139  K 3.9 4.0  CL 105 104  CO2 26 26  BUN 49* 61*  CREATININE 2.53* 2.89*  GLUCOSE 127* 123*  CALCIUM 8.6* 8.7*   No results for input(s): LABPT, INR in the last 72 hours.  EXAM General - Patient is Alert, Appropriate and Oriented Extremity - Sensation intact distally Intact pulses distally Dorsiflexion/Plantar flexion intact No cellulitis present Compartment soft Dressing - dressing C/D/I and no drainage.  Wound VAC is working well. Motor Function - intact, moving foot and toes well on exam.   Past Medical History:  Diagnosis Date  . Arthritis   . Cancer [facility])    prostate  . Diabetes mellitus without complication ([place])   . Hypertension   . Sleep apnea     Assessment/Plan:   2 Days Post-Op Procedure(s) (LRB): TOTAL HIP ARTHROPLASTY ANTERIOR APPROACH (Left) Active Problems:   Hip fracture (HCC)  Estimated body mass index is 32.78 kg/m as calculated from the following:   Height as of  this encounter: 5\' 11"  (1.803 m).   Weight as of this encounter: 106.6 kg. Advance diet Up with therapy  Needs BM Acute post op blood loss anemia - Hgb 9.1. Recheck labs in the am.  VSS CM to assist with discharge.  Planning to go home possibly today or tomorrow  DVT Prophylaxis - Lovenox, TED hose and SCDs Weight-Bearing as tolerated to left leg  Reche Dixon, PA-C [place] 09/23/2018, 7:10 AM

## 2018-09-23 NOTE — TOC Transition Note (Signed)
Transition of Care [facility]) - CM/SW Discharge Note   Patient Details  Name: Eric Russell MRN: [id number] Date of Birth: [birth year]  [facility] CM/SW Contact:  Torin Whisner, [place] Beets, [place] Phone Number: [phone]  09/23/2018, 1:08 PM   Clinical Narrative: Per RN patient will D/C with a provena disposable wound vac. Per [place] [employer] agency representative the hospital bed will be delivered to patient's house between 1pm and 5pm today. Patient's wife Eric Russell is aware of above and will call the nurse when the hospital bed has arrived. CSW left a voicemail for Lauren with [facility] making her aware of D/C today. CSW emailed worker's comp Barr[employer] clerk making her aware of D/C today. RN aware of above. Please reconsult if future social work needs arise. CSW signing off.     Final next level of care: [place] Barriers to Discharge: Barriers Resolved   Patient Goals and CMS Choice Patient states their goals for this hospitalization and ongoing recovery are:: Pain control.      Discharge Placement                       Discharge Plan and Services                DME Arranged: Hospital bed DME Agency: [employer] Date DME Agency Contacted: 09/22/18   Representative spoke with at DME Agency: [place]: PT [place]: [place] Date [place]: 09/22/18   Representative spoke with at [place]: Ander Purpura  Social Determinants of Health ([place]) Interventions     Readmission Risk Interventions No flowsheet data found.

## 2018-09-23 NOTE — Discharge Instructions (Signed)
ANTERIOR APPROACH TOTAL HIP REPLACEMENT POSTOPERATIVE DIRECTIONS   Hip Rehabilitation, Guidelines Following Surgery  The results of a hip operation are greatly improved after range of motion and muscle strengthening exercises. Follow all safety measures which are given to protect your hip. If any of these exercises cause increased pain or swelling in your joint, decrease the amount until you are comfortable again. Then slowly increase the exercises. Call your caregiver if you have problems or questions.   HOME CARE INSTRUCTIONS  Remove items at home which could result in a fall. This includes throw rugs or furniture in walking pathways.   ICE to the affected hip every three hours for 30 minutes at a time and then as needed for pain and swelling.  Continue to use ice on the hip for pain and swelling from surgery. You may notice swelling that will progress down to the foot and ankle.  This is normal after surgery.  Elevate the leg when you are not up walking on it.    Continue to use the breathing machine which will help keep your temperature down.  It is common for your temperature to cycle up and down following surgery, especially at night when you are not up moving around and exerting yourself.  The breathing machine keeps your lungs expanded and your temperature down.  Do not place pillow under knee, focus on keeping the knee straight while resting  DIET You may resume your previous home diet once your are discharged from the hospital.  DRESSING / WOUND CARE / SHOWERING The dressing is a wound VAC.  The wound VAC will remain in place until the canister begins to be consistently.  It can then be changed if it is full, and if it has been 7 days, can be removed.  After removal, apply a bandage to cover the wound.  Physical therapy can assist with this. Keep the surgical dressing until follow up.  The dressing is water proof, so you can shower without any extra covering.  IF THE DRESSING FALLS OFF  or the wound gets wet inside, change the dressing with sterile gauze.  Please use good hand washing techniques before changing the dressing.  Do not use any lotions or creams on the incision until instructed by your surgeon.   Keep your dressing dry with showering.  You can keep it covered and pat dry. Change the surgical dressing daily and reapply a dry dressing each time.  ACTIVITY Walk with your walker as instructed. Use walker as long as suggested by your caregivers. Avoid periods of inactivity such as sitting longer than an hour when not asleep. This helps prevent blood clots.  You may resume a sexual relationship in one month or when given the OK by your doctor.  You may return to work once you are cleared by your doctor.  Do not drive a car for 6 weeks or until released by you surgeon.  Do not drive while taking narcotics.  WEIGHT BEARING Weight bearing as tolerated with assist device (walker, cane, etc) as directed, use it as long as suggested by your surgeon or therapist, typically at least 4-6 weeks.  POSTOPERATIVE CONSTIPATION PROTOCOL Constipation - defined medically as fewer than three stools per week and severe constipation as less than one stool per week.  One of the most common issues patients have following surgery is constipation.  Even if you have a regular bowel pattern at home, your normal regimen is likely to be disrupted due to multiple reasons  following surgery.  Combination of anesthesia, postoperative narcotics, change in appetite and fluid intake all can affect your bowels.  In order to avoid complications following surgery, here are some recommendations in order to help you during your recovery period.  Colace (docusate) - Pick up an over-the-counter form of Colace or another stool softener and take twice a day as long as you are requiring postoperative pain medications.  Take with a full glass of water daily.  If you experience loose stools or diarrhea, hold the  colace until you stool forms back up.  If your symptoms do not get better within 1 week or if they get worse, check with your doctor.  Dulcolax (bisacodyl) - Pick up over-the-counter and take as directed by the product packaging as needed to assist with the movement of your bowels.  Take with a full glass of water.  Use this product as needed if not relieved by Colace only.   MiraLax (polyethylene glycol) - Pick up over-the-counter to have on hand.  MiraLax is a solution that will increase the amount of water in your bowels to assist with bowel movements.  Take as directed and can mix with a glass of water, juice, soda, coffee, or tea.  Take if you go more than two days without a movement. Do not use MiraLax more than once per day. Call your doctor if you are still constipated or irregular after using this medication for 7 days in a row.  If you continue to have problems with postoperative constipation, please contact the office for further assistance and recommendations.  If you experience "the worst abdominal pain ever" or develop nausea or vomiting, please contact the office immediatly for further recommendations for treatment.  ITCHING  If you experience itching with your medications, try taking only a single pain pill, or even half a pain pill at a time.  You can also use Benadryl over the counter for itching or also to help with sleep.   TED HOSE STOCKINGS Wear the elastic stockings on both legs for three weeks following surgery during the day but you may remove then at night for sleeping.  MEDICATIONS See your medication summary on the After Visit Summary that the nursing staff will review with you prior to discharge.  You may have some home medications which will be placed on hold until you complete the course of blood thinner medication.  It is important for you to complete the blood thinner medication as prescribed by your surgeon.  Continue your approved medications as instructed at time  of discharge.  PRECAUTIONS If you experience chest pain or shortness of breath - call 911 immediately for transfer to the hospital emergency department.  If you develop a fever greater that 101 F, purulent drainage from wound, increased redness or drainage from wound, foul odor from the wound/dressing, or calf pain - CONTACT YOUR SURGEON.                                                   FOLLOW-UP APPOINTMENTS Make sure you keep all of your appointments after your operation with your surgeon and caregivers. You should call the office at the above phone number and make an appointment for approximately two weeks after the date of your surgery or on the date instructed by your surgeon outlined in the "After Visit Summary".  RANGE OF MOTION AND STRENGTHENING EXERCISES  These exercises are designed to help you keep full movement of your hip joint. Follow your caregiver's or physical therapist's instructions. Perform all exercises about fifteen times, three times per day or as directed. Exercise both hips, even if you have had only one joint replacement. These exercises can be done on a training (exercise) mat, on the floor, on a table or on a bed. Use whatever works the best and is most comfortable for you. Use music or television while you are exercising so that the exercises are a pleasant break in your day. This will make your life better with the exercises acting as a break in routine you can look forward to.  Lying on your back, slowly slide your foot toward your buttocks, raising your knee up off the floor. Then slowly slide your foot back down until your leg is straight again.  Lying on your back spread your legs as far apart as you can without causing discomfort.  Lying on your side, raise your upper leg and foot straight up from the floor as far as is comfortable. Slowly lower the leg and repeat.  Lying on your back, tighten up the muscle in the front of your thigh (quadriceps muscles). You can do  this by keeping your leg straight and trying to raise your heel off the floor. This helps strengthen the largest muscle supporting your knee.  Lying on your back, tighten up the muscles of your buttocks both with the legs straight and with the knee bent at a comfortable angle while keeping your heel on the floor.   IF YOU ARE TRANSFERRED TO A SKILLED REHAB FACILITY If the patient is transferred to a skilled rehab facility following release from the hospital, a list of the current medications will be sent to the facility for the patient to continue.  When discharged from the skilled rehab facility, please have the facility set up the patient's [place] prior to being released. Also, the skilled facility will be responsible for providing the patient with their medications at time of release from the facility to include their pain medication, the muscle relaxants, and their blood thinner medication. If the patient is still at the rehab facility at time of the two week follow up appointment, the skilled rehab facility will also need to assist the patient in arranging follow up appointment in our office and any transportation needs.  MAKE SURE YOU:  Understand these instructions.  Get help right away if you are not doing well or get worse.    Pick up stool softner and laxative for home use following surgery while on pain medications. Do not submerge incision under water. Please use good hand washing techniques while changing dressing each day. May shower starting three days after surgery. Please use a clean towel to pat the incision dry following showers. Continue to use ice for pain and swelling after surgery. Do not use any lotions or creams on the incision until instructed by your surgeon.

## 2018-09-25 LAB — SURGICAL PATHOLOGY

## 2018-09-25 NOTE — Discharge Summary (Signed)
[facility] [place] NAME: Eric Russell    MR#:  [id number]  DATE OF BIRTH:  1956/01/14  DATE OF ADMISSION:  09/19/2018   ADMITTING PHYSICIAN: Fritzi Mandes, MD  DATE OF DISCHARGE: 09/23/2018  7:37 PM  PRIMARY CARE PHYSICIAN: Rusty Aus, MD   ADMISSION DIAGNOSIS:   Pre-op evaluation [Z01.818]  DISCHARGE DIAGNOSIS:   Active Problems:   Hip fracture ([place])   SECONDARY DIAGNOSIS:   Past Medical History:  Diagnosis Date  . Arthritis   . Cancer [facility])    prostate  . Diabetes mellitus without complication ([place])   . Hypertension   . Sleep apnea     HOSPITAL COURSE:   [name] a62 y.o.malewith a known history of hypertension, diabetes, hyperlipidemia and severe sleep apnea where CPAP comes to the emergency room after he fell tripped on a few boxes at work and left hip fracture.  1.Left acute hip fracture/femoral neck fracture status post mechanical fall at work -Appreciate Ortho consult.  Continue pain control -Postop day 2 today, doing very well with physical therapy - Slight drop in hemoglobin noted but no need for transfusion. -Plan to discharge home with home health   2.Hypertension  -continue Coreg, amlodipine, lisinopril, hydralazine and Maxizde  3.  CKD stage IV-most recent baseline creatinine as outpatient at 2.4.   -Creatinine remained stable around 2.8 while in the hospital.-Monitor closely  4.Type II diabetes -patient on metformin  5.Leukocytosis appears reactive.  Improving now.  Urine analysis with no infection  6.DVT prophylaxis-Lovenox for 2 weeks at discharge  7.Severe obstructive sleep apnea. Patient uses CPAP at home.  Continue  Patient will be discharged home with home health today    DISCHARGE CONDITIONS:   Guarded  CONSULTS OBTAINED:   Treatment Team:  Poggi, Marshall Cork, MD Hessie Knows, MD  DRUG ALLERGIES:   No Known Allergies DISCHARGE MEDICATIONS:    Allergies as of 09/23/2018   No Known Allergies     Medication List    TAKE these medications   acetaminophen 500 MG tablet Commonly known as: TYLENOL Take 500-1,000 mg by mouth every 6 (six) hours as needed for mild pain, moderate pain or fever. Notes to patient: anytime   amLODipine 10 MG tablet Commonly known as: NORVASC Take 10 mg by mouth daily. Notes to patient: Tomorrow 6/21   carvedilol 25 MG tablet Commonly known as: COREG Take 50 mg by mouth 2 (two) times daily with a meal. Notes to patient: Tomorrow 6/21   docusate sodium 100 MG capsule Commonly known as: COLACE Take 1 capsule (100 mg total) by mouth 2 (two) times daily. Notes to patient: Tonight 6/20   enoxaparin 40 MG/0.4ML injection Commonly known as: LOVENOX Inject 0.4 mLs (40 mg total) into the skin daily for 14 doses. Notes to patient: Tomorrow 6/21   furosemide 20 MG tablet Commonly known as: LASIX Take 10 mg by mouth daily. Notes to patient: Tomorrow 6/21   hydrALAZINE 50 MG tablet Commonly known as: APRESOLINE Take 50 mg by mouth 2 (two) times daily. Notes to patient: Tonight 6/21   HYDROcodone-acetaminophen 5-325 MG tablet Commonly known as: NORCO/VICODIN Take 1-2 tablets by mouth every 4 (four) hours as needed for moderate pain (pain score 4-6). Notes to patient: anytime   lisinopril 20 MG tablet Commonly known as: ZESTRIL Take 20 mg by mouth daily. Notes to patient: Tomorrow 6/21   metFORMIN 500 MG tablet Commonly known as: GLUCOPHAGE Take 500 mg by mouth 2 (  two) times daily. Notes to patient: Tonight 09/23/18   multivitamin with minerals tablet Take 1 tablet by mouth daily. Notes to patient: Tomorrow 6/21   simvastatin 10 MG tablet Commonly known as: ZOCOR Take 10 mg by mouth at bedtime. Notes to patient: Tonight 6/20   traMADol 50 MG tablet Commonly known as: ULTRAM Take 1 tablet (50 mg total) by mouth every 6 (six) hours. Notes to patient: Anytime after 1113am today 6/20    triamterene-hydrochlorothiazide 75-50 MG tablet Commonly known as: MAXZIDE Take 1 tablet by mouth daily. Notes to patient: Tomorrow 6/21        DISCHARGE INSTRUCTIONS:   1.  PCP follow-up in 1 to 2 weeks 2.  Orthopedic follow-up in 2 weeks  DIET:   Cardiac diet  ACTIVITY:   Activity as tolerated  OXYGEN:   Home Oxygen: No.  Oxygen Delivery: room air  DISCHARGE LOCATION:   home   If you experience worsening of your admission symptoms, develop shortness of breath, life threatening emergency, suicidal or homicidal thoughts you must seek medical attention immediately by calling 911 or calling your MD immediately  if symptoms less severe.  You Must read complete instructions/literature along with all the possible adverse reactions/side effects for all the Medicines you take and that have been prescribed to you. Take any new Medicines after you have completely understood and accpet all the possible adverse reactions/side effects.   Please note  You were cared for by a hospitalist during your hospital stay. If you have any questions about your discharge medications or the care you received while you were in the hospital after you are discharged, you can call the unit and asked to speak with the hospitalist on call if the hospitalist that took care of you is not available. Once you are discharged, your primary care physician will handle any further medical issues. Please note that NO REFILLS for any discharge medications will be authorized once you are discharged, as it is imperative that you return to your primary care physician (or establish a relationship with a primary care physician if you do not have one) for your aftercare needs so that they can reassess your need for medications and monitor your lab values.    On the day of Discharge:  VITAL SIGNS:   Blood pressure 128/63, pulse 62, temperature 98.9 F (37.2 C), temperature source Oral, resp. rate 18, height 5\' 11"   (1.803 m), weight 106.6 kg, SpO2 94 %.  PHYSICAL EXAMINATION:    GENERAL:  63 y.o.-year-old patient lying in the bed with no acute distress.  EYES: Pupils equal, round, reactive to light and accommodation. No scleral icterus. Extraocular muscles intact.  HEENT: Head atraumatic, normocephalic. Oropharynx and nasopharynx clear.  NECK:  Supple, no jugular venous distention. No thyroid enlargement, no tenderness.  LUNGS: Normal breath sounds bilaterally, no wheezing, rales,rhonchi or crepitation. No use of accessory muscles of respiration.  CARDIOVASCULAR: S1, S2 normal. No murmurs, rubs, or gallops.  ABDOMEN: Soft, nontender, nondistended. Bowel sounds present. No organomegaly or mass.  EXTREMITIES: Left hip  dressing in place..  No pedal edema, cyanosis, or clubbing.  NEUROLOGIC: Cranial nerves II through XII are intact. Muscle strength 5/5 in all extremities except left lower leg, limited range of motion due to pain. Sensation intact. Gait not checked.  PSYCHIATRIC: The patient is alert and oriented x 3.  SKIN: No obvious rash, lesion, or ulcer.   DATA REVIEW:   CBC Recent Labs  Lab 09/23/18 0434  WBC 11.0*  HGB 9.1*  HCT 27.4*  PLT 183    Chemistries  Recent Labs  Lab 09/23/18 0434  NA 139  K 4.0  CL 104  CO2 26  GLUCOSE 123*  BUN 61*  CREATININE 2.89*  CALCIUM 8.7*     Microbiology Results  Results for orders placed or performed during the hospital encounter of 09/19/18  SARS Coronavirus 2 (CEPHEID - Performed in [facility] hospital lab), Hosp Order     Status: None   Collection Time: 09/19/18  2:40 PM   Specimen: Nasopharyngeal Swab  Result Value Ref Range Status   SARS Coronavirus 2 NEGATIVE NEGATIVE Final    Comment: (NOTE) If result is NEGATIVE SARS-CoV-2 target nucleic acids are NOT DETECTED. The SARS-CoV-2 RNA is generally detectable in upper and lower  respiratory specimens during the acute phase of infection. The lowest  concentration of SARS-CoV-2  viral copies this assay can detect is 250  copies / mL. A negative result does not preclude SARS-CoV-2 infection  and should not be used as the sole basis for treatment or other  patient management decisions.  A negative result may occur with  improper specimen collection / handling, submission of specimen other  than nasopharyngeal swab, presence of viral mutation(s) within the  areas targeted by this assay, and inadequate number of viral copies  (<250 copies / mL). A negative result must be combined with clinical  observations, patient history, and epidemiological information. If result is POSITIVE SARS-CoV-2 target nucleic acids are DETECTED. The SARS-CoV-2 RNA is generally detectable in upper and lower  respiratory specimens dur ing the acute phase of infection.  Positive  results are indicative of active infection with SARS-CoV-2.  Clinical  correlation with patient history and other diagnostic information is  necessary to determine patient infection status.  Positive results do  not rule out bacterial infection or co-infection with other viruses. If result is PRESUMPTIVE POSTIVE SARS-CoV-2 nucleic acids MAY BE PRESENT.   A presumptive positive result was obtained on the submitted specimen  and confirmed on repeat testing.  While 2019 novel coronavirus  (SARS-CoV-2) nucleic acids may be present in the submitted sample  additional confirmatory testing may be necessary for epidemiological  and / or clinical management purposes  to differentiate between  SARS-CoV-2 and other Sarbecovirus currently known to infect humans.  If clinically indicated additional testing with an alternate test  methodology [id number]) is advised. The SARS-CoV-2 RNA is generally  detectable in upper and lower respiratory sp ecimens during the acute  phase of infection. The expected result is Negative. Fact Sheet for Patients:  StrictlyIdeas.no Fact Sheet for Healthcare Providers:  BankingDealers.co.za This test is not yet approved or cleared by the [place] FDA and has been authorized for detection and/or diagnosis of SARS-CoV-2 by FDA under an Emergency Use Authorization (EUA).  This EUA will remain in effect (meaning this test can be used) for the duration of the COVID-19 declaration under Section 564(b)(1) of the Act, 21 U.S.C. section 360bbb-3(b)(1), unless the authorization is terminated or revoked sooner. Performed at [facility], [address], [place], [place] [address]   Surgical PCR screen     Status: None   Collection Time: 09/19/18 10:21 PM   Specimen: Nasal Mucosa; Nasal Swab  Result Value Ref Range Status   MRSA, PCR NEGATIVE NEGATIVE Final   Staphylococcus aureus NEGATIVE NEGATIVE Final    Comment: (NOTE) The Xpert SA Assay (FDA approved for NASAL specimens in patients 58 years of age and older),  is one component of a comprehensive surveillance program. It is not intended to diagnose infection nor to guide or monitor treatment. Performed at [facility], [address], [place], [place] [address]     RADIOLOGY:  No results found.   Management plans discussed with the patient, family and they are in agreement.  CODE STATUS:  Code Status History    Date Active Date Inactive Code Status Order ID Comments User Context   09/19/2018 1708 09/23/2018 2337 Full Code [id number]  Fritzi Mandes, MD Inpatient   06/29/2016 1115 07/01/2016 2322 Full Code [id number]  Hessie Knows, MD Inpatient   Advance Care Planning Activity      TOTAL TIME TAKING CARE OF THIS PATIENT: 38 minutes.    Gladstone Lighter M.D on 09/25/2018 at 10:42 AM  Between 7am to 6pm - Pager - [phone]  After 6pm go to [email or web address] - Proofreader  [facility]  Office  [phone]  CC: Primary care physician; Rusty Aus, MD   Note: This dictation was prepared with Dragon dictation along  with smaller phrase technology. Any transcriptional errors that result from this process are unintentional.

## 2018-10-05 DIAGNOSIS — N184 Chronic kidney disease, stage 4 (severe): Secondary | ICD-10-CM | POA: Insufficient documentation

## 2018-10-19 IMAGING — CT CT KNEE*R* W/O CM
3 of 6 series · 10 of 33 positions shown, 12 images · non-contrast
Comparison: None.

CLINICAL DATA: Pre-op MyKnee TKA. Right knee pain. Patient has had
right knee pain for several years. X-rays show osteoarthritis, he
has complete loss of joint space in the medial compartment.

EXAM:
CT OF THE  KNEE WITHOUT CONTRAST
TECHNIQUE: Multidetector CT imaging of the knee was performed according to the
standard protocol. Multiplanar CT image reconstructions were also
generated.

[Series 4: axial bone · axial · 0.39mm/px · z∈[+524,+666]mm · 2 of 285 slices shown, 3 images]
[im 72/285  soft-tissue]
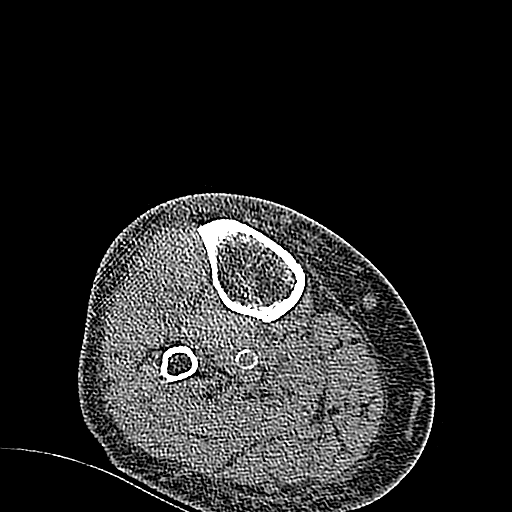
[im 72/285  bone]
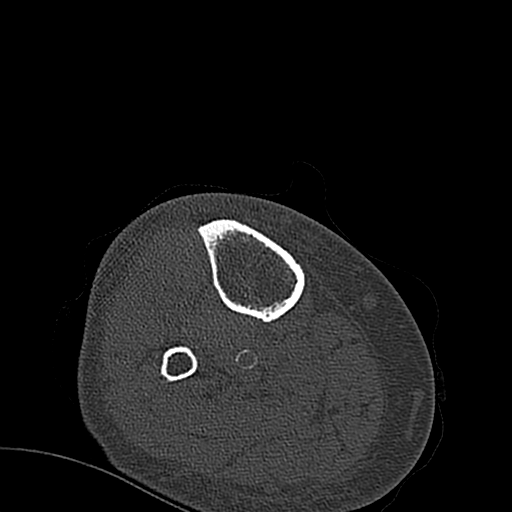
[im 214/285  bone]
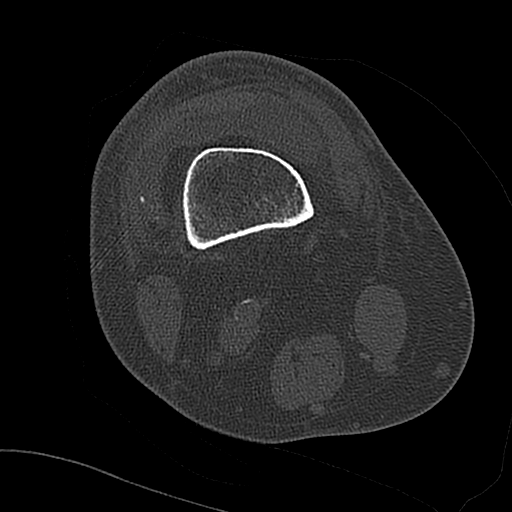

[Series 8: coronal st · coronal · 0.28mm/px · 3 of 71 slices shown]
[im 15/71  bone]
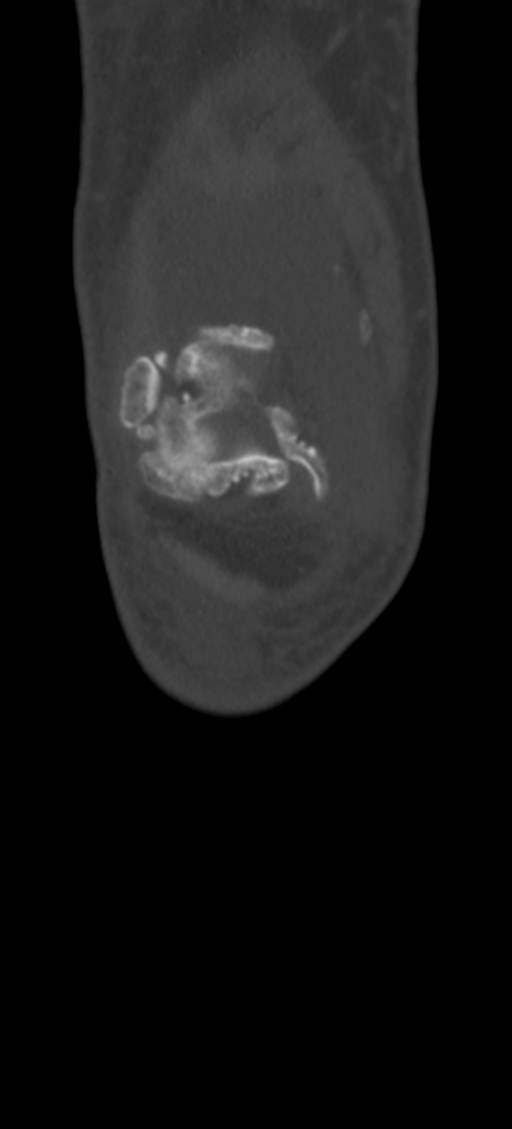
[im 29/71  bone]
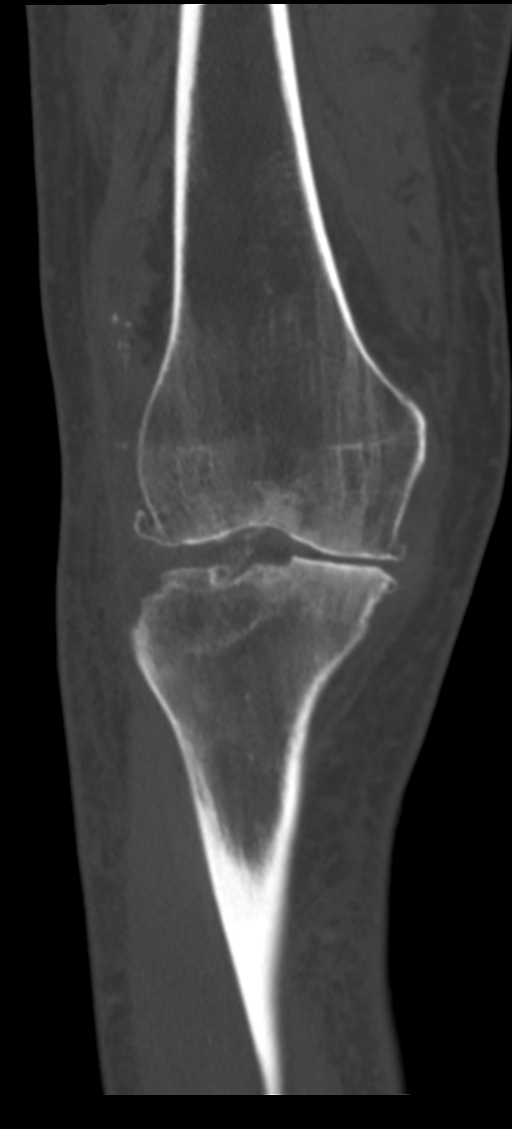
[im 43/71  bone]
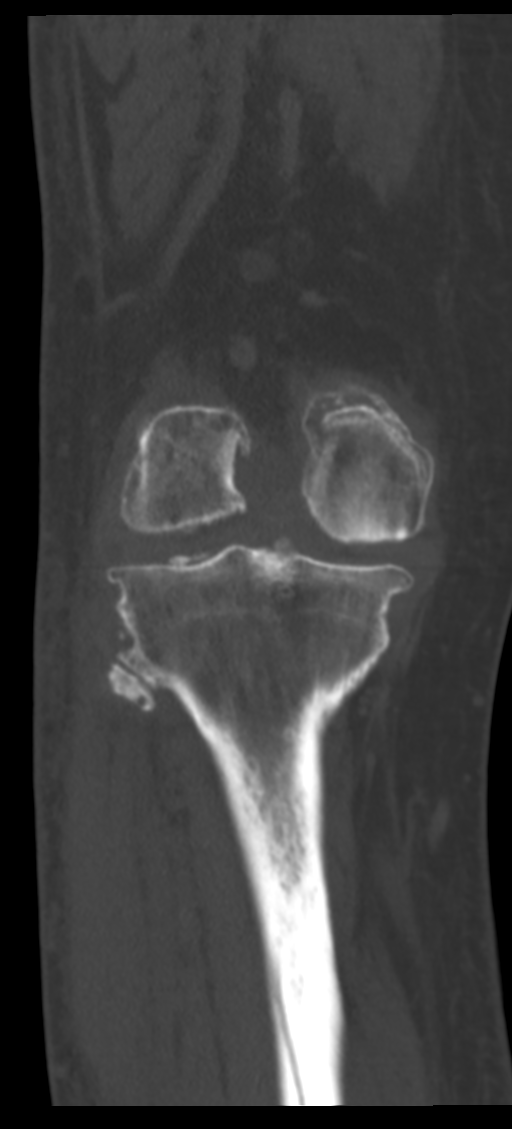

[Series 9: sagittal st · sagittal · 0.35mm/px · 5 of 64 slices shown, 6 images]
[im 22/64  bone]
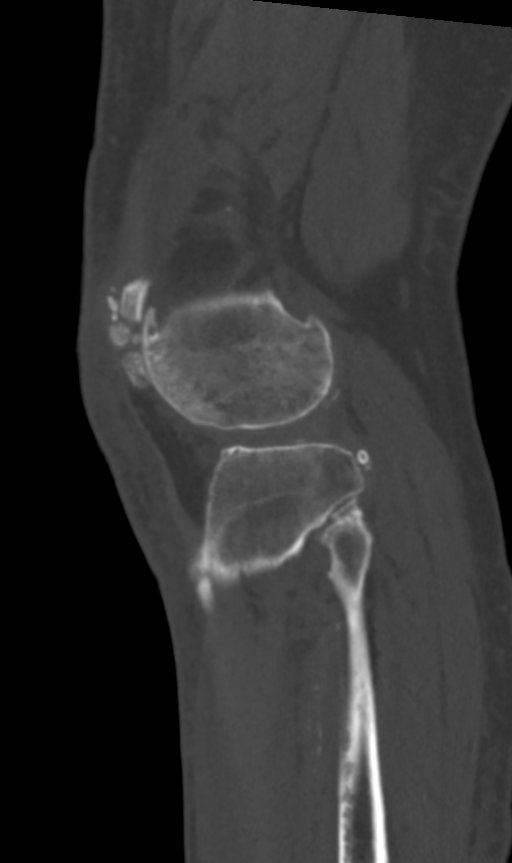
[im 27/64  bone]
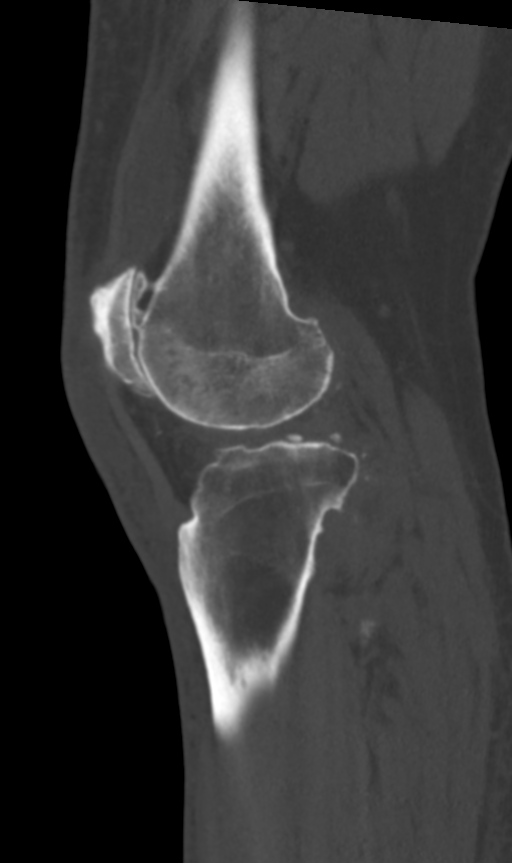
[im 32/64  soft-tissue]
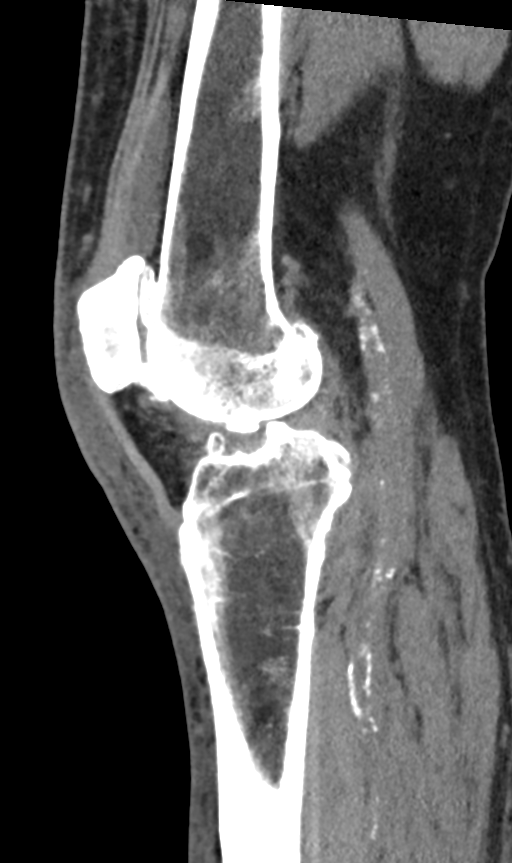
[im 32/64  bone]
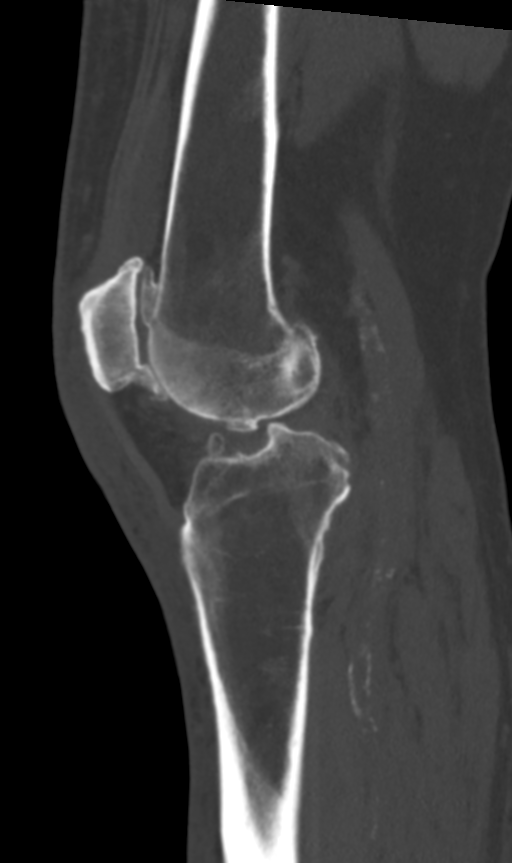
[im 37/64  bone]
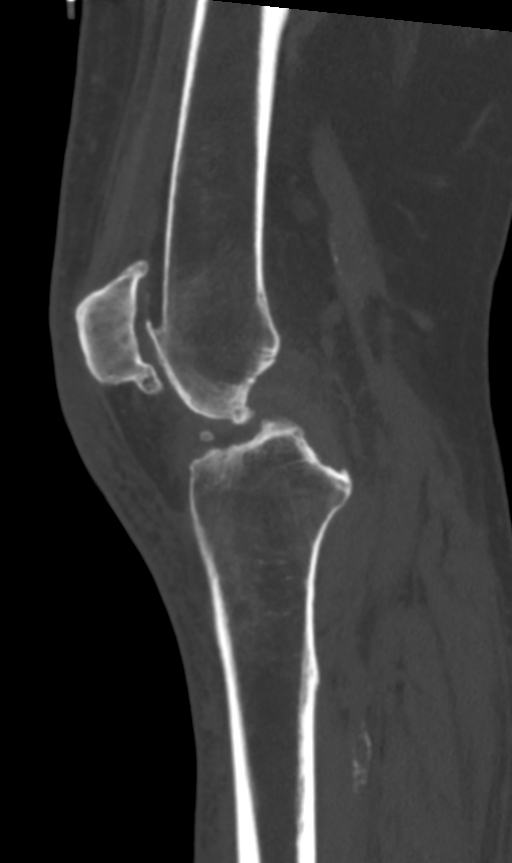
[im 43/64  bone]
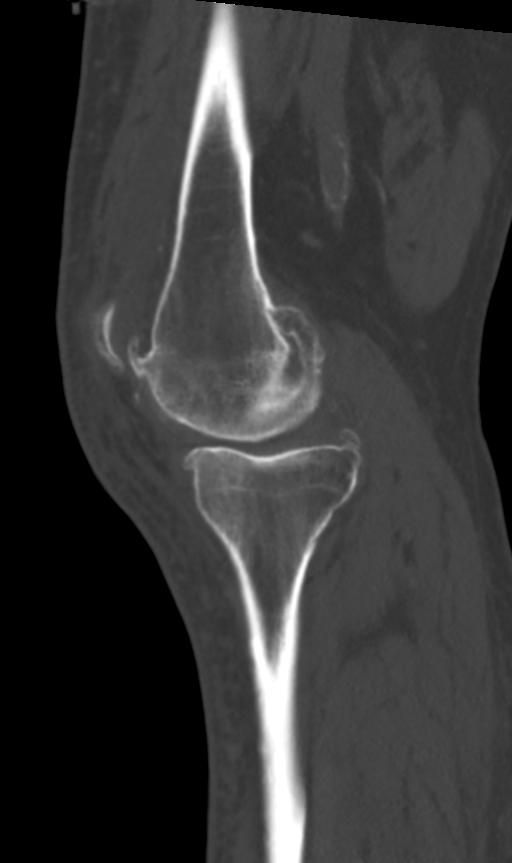

[10 of 33 positions shown; findings below may reference images not displayed]

FINDINGS: Bones/Joint/Cartilage

The hip demonstrates no fracture or dislocation. There is no lytic
or blastic lesion.

The knee demonstrates no fracture or dislocation. There is no lytic
or blastic lesion. There is severe medial femorotibial compartment
osteoarthritis with joint space, subchondral sclerosis marginal
osteophytosis. There is mild -moderate osteoarthritis of the lateral
femorotibial compartment with marginal osteophytosis. There is
severe patellofemoral compartment osteoarthritis with joint space
narrowing and marginal osteophytosis. There is a large joint
effusion. There is a small Baker cyst. There is focal fluid anterior
to the patella and patellar tendon as can be seen with prepatellar
bursitis.

The ankle demonstrates no fracture or dislocation. There is no lytic
or blastic lesion.

Ligaments

Ligaments are suboptimally evaluated by CT.

Muscles and Tendons
The muscles are normal. There is no muscle atrophy. There is no
intramuscular fluid collection. The quadriceps tendon and patellar
tendon are intact.

Soft tissue
There is no other fluid collection or hematoma. There is no soft
tissue mass. There is peripheral vascular atherosclerotic disease.
IMPRESSION: Tricompartmental osteoarthritis of the right knee as described
above.

## 2018-11-15 DIAGNOSIS — Z8781 Personal history of (healed) traumatic fracture: Secondary | ICD-10-CM | POA: Insufficient documentation

## 2018-12-18 ENCOUNTER — Encounter
Admission: RE | Admit: 2018-12-18 | Discharge: 2018-12-18 | Disposition: A | Discharge status: Home / Self Care | Payer: Worker's Compensation | Source: Ambulatory Visit | Attending: Orthopedic Surgery | Admitting: Orthopedic Surgery

## 2018-12-18 ENCOUNTER — Other Ambulatory Visit: Payer: Self-pay

## 2018-12-18 DIAGNOSIS — Z01812 Encounter for preprocedural laboratory examination: Secondary | ICD-10-CM | POA: Insufficient documentation

## 2018-12-18 HISTORY — DX: Anemia, unspecified: D64.9

## 2018-12-18 HISTORY — DX: Chronic kidney disease, unspecified: N18.9

## 2018-12-18 HISTORY — DX: Other complications of anesthesia, initial encounter: T88.59XA

## 2018-12-18 LAB — HEMOGLOBIN: Hemoglobin: 10 g/dL — ABNORMAL LOW (ref 13.0–17.0)

## 2018-12-18 LAB — POTASSIUM: Potassium: 3 mmol/L — ABNORMAL LOW (ref 3.5–5.1)

## 2018-12-18 NOTE — Patient Instructions (Addendum)
Your procedure is scheduled on: 12-26-18 TUESDAY Report to Same Day Surgery 2nd floor medical mall [facility] Entrance-take elevator on left to 2nd floor.  Check in with surgery information desk.) To find out your arrival time please call [phone] between 1PM - 3PM on 12-25-18 MONDAY  Remember: Instructions that are not followed completely may result in serious medical risk, up to and including death, or upon the discretion of your surgeon and anesthesiologist your surgery may need to be rescheduled.    _x___ 1. Do not eat food after midnight the night before your procedure. NO GUM OR CANDY AFTER MIDNIGHT. You may drink WATER up to 2 hours before you are scheduled to arrive at the hospital for your procedure.  Do not drink WATER within 2 hours of your scheduled arrival to the hospital.  Type 1 and type 2 diabetics should only drink water.   ____Ensure clear carbohydrate drink on the way to the hospital for bariatric patients  ____Ensure clear carbohydrate drink 3 hours before surgery.     __x__ 2. No Alcohol for 24 hours before or after surgery.   __x__3. No Smoking or e-cigarettes for 24 prior to surgery.  Do not use any chewable tobacco products for at least 6 hour prior to surgery   ____  4. Bring all medications with you on the day of surgery if instructed.    __x__ 5. Notify your doctor if there is any change in your medical condition     (cold, fever, infections).    x___6. On the morning of surgery brush your teeth with toothpaste and water.  You may rinse your mouth with mouth wash if you wish.  Do not swallow any toothpaste or mouthwash.   Do not wear jewelry, make-up, hairpins, clips or nail polish.  Do not wear lotions, powders, or perfumes.  Do not shave 48 hours prior to surgery. Men may shave face and neck.  Do not bring valuables to the hospital.    [facility] is not responsible for any belongings or valuables.               Contacts, dentures or bridgework  may not be worn into surgery.  Leave your suitcase in the car. After surgery it may be brought to your room.  For patients admitted to the hospital, discharge time is determined by your treatment team.  _  Patients discharged the day of surgery will not be allowed to drive home.  You will need someone to drive you home and stay with you the night of your procedure.    Please read over the following fact sheets that you were given:   [facility] Preparing for Surgery  _x___ TAKE THE FOLLOWING MEDICATION THE MORNING OF SURGERY WITH A SMALL SIP OF WATER. These include:  1. AMLODIPINE (NORVASC)  2. COREG (CARVEDILOL)  3. HYDRALAZINE (APRESOLINE)  4. SINGULAIR (MONTELUKAST)  5. ZOCOR (SIMVASTATIN)  6. EXELON (RIVASTIGMINE)  ____Fleets enema or Magnesium Citrate as directed.   _x___ Use CHG Soap or sage wipes as directed on instruction sheet   ____ Use inhalers on the day of surgery and bring to hospital day of surgery  ____ Stop Metformin and Janumet 2 days prior to surgery.    ____ Take 1/2 of usual insulin dose the night before surgery and none on the morning surgery.   ____ Follow recommendations from Cardiologist, Pulmonologist or PCP regarding stopping Aspirin, Coumadin, Plavix ,Eliquis, Effient, or Pradaxa, and Pletal.  X____Stop Anti-inflammatories such  as Advil, Aleve, Ibuprofen, Motrin, Naproxen, Naprosyn, Goodies powders or aspirin products NOW-OK to take Tylenol   ____ Stop supplements until after surgery   _X___ Bring C-Pap to the hospital.    [facility] TESTING ON Friday 12-22-18 BETWEEN 8 AM-10:30 AM AT [place]

## 2018-12-22 ENCOUNTER — Other Ambulatory Visit: Payer: Self-pay

## 2018-12-22 ENCOUNTER — Other Ambulatory Visit
Admission: RE | Admit: 2018-12-22 | Discharge: 2018-12-22 | Disposition: A | Discharge status: Home / Self Care | Payer: BC Managed Care – PPO | Source: Ambulatory Visit | Attending: Orthopedic Surgery | Admitting: Orthopedic Surgery

## 2018-12-22 DIAGNOSIS — Z01818 Encounter for other preprocedural examination: Secondary | ICD-10-CM | POA: Insufficient documentation

## 2018-12-22 DIAGNOSIS — Z20828 Contact with and (suspected) exposure to other viral communicable diseases: Secondary | ICD-10-CM | POA: Insufficient documentation

## 2018-12-22 DIAGNOSIS — Z01812 Encounter for preprocedural laboratory examination: Secondary | ICD-10-CM | POA: Insufficient documentation

## 2018-12-22 LAB — SARS CORONAVIRUS 2 (TAT 6-24 HRS): SARS Coronavirus 2: NEGATIVE

## 2018-12-25 MED ORDER — CEFAZOLIN SODIUM-DEXTROSE 2-4 GM/100ML-% IV SOLN
2.0000 g | Freq: Once | INTRAVENOUS | Status: AC
  Administered 2018-12-26: 2 g via INTRAVENOUS

## 2018-12-25 NOTE — Pre-Procedure Instructions (Signed)
EKG  Reviewed entered by me at 1210 Heart rate 60 QRS 120 QTc 420 Normal sinus rhythm, incomplete right bundle ____________________________________________  RADIOLOGY  Dg Chest 1 View  Result Date: 09/19/2018 CLINICAL DATA:  Pain status post fall. EXAM: CHEST  1 VIEW COMPARISON:  Chest x-ray from 2016 could not be retrieved for comparison at the time of this dictation. FINDINGS: The heart size is enlarged. Aortic calcifications are noted. There is no pneumothorax. No large pleural effusion. No focal infiltrate. IMPRESSION: No active disease. Electronically Signed   By: Constance Holster M.D.   On: 09/19/2018 14:45   Dg Finger Middle Right  Result Date: 09/19/2018 CLINICAL DATA:  Pain status post fall EXAM: RIGHT MIDDLE FINGER 2+V COMPARISON:  None. FINDINGS: There is a subtle lucency through the base of the proximal phalanx of the third digit. There are advanced degenerative changes of the distal interphalangeal joint of the third digit. There is soft tissue swelling. There is some age-indeterminate subluxation of the DIP. IMPRESSION: 1. Lucency through the base of the proximal phalanx of the third digit may represent a nondisplaced fracture. 2. Advanced degenerative changes of the DIP with some dorsal subluxation of the distal phalanx, which is age indeterminate but favored to be chronic and secondary to the advanced degenerative changes. 3. Soft tissue swelling about the third digit. Electronically Signed   By: Constance Holster M.D.   On: 09/19/2018 14:43   Dg Hip Unilat W Or Wo Pelvis 2-3 Views Left  Result Date: 09/19/2018 CLINICAL DATA:  Pain EXAM: DG HIP (WITH OR WITHOUT PELVIS) 2-3V LEFT COMPARISON:  None. FINDINGS: There is an acute displaced fracture of the left femoral neck. There is no dislocation. There are advanced degenerative changes of both hips. There is diffuse osteopenia. There is some regularity of the pubic rami on the left which may represent old healed  fractures. IMPRESSION: Acute displaced fracture of the proximal left femur as above. Electronically Signed   By: Constance Holster M.D.   On: 09/19/2018 14:41    ____________________________________________   PROCEDURES  Procedure(s) performed: None  Procedures  Critical Care performed: No  ____________________________________________   INITIAL IMPRESSION / ASSESSMENT AND PLAN / ED COURSE  Pertinent labs & imaging results that were available during my care of the patient were reviewed by me and considered in my medical decision making (see chart for details).  Mechanical fall.  Focal pain primarily over the left hip, also some slight injury to her right middle finger.  Will image both.  Pain control.  X-rays anticipated  Eric Russell was evaluated in Emergency Department on 09/19/2018 for the symptoms described in the history of present illness. He was evaluated in the context of the global COVID-19 pandemic, which necessitated consideration that the patient might be at risk for infection with the SARS-CoV-2 virus that causes COVID-19. Institutional protocols and algorithms that pertain to the evaluation of patients at risk for COVID-19 are in a state of rapid change based on information released by regulatory bodies including the CDC and federal and state organizations. These policies and algorithms were followed during the patient's care in the ED.  The patient does not have any obvious COVID symptoms    ----------------------------------------- 3:02 PM on 09/19/2018 -----------------------------------------  Dr. Roland Rack of orthopedics advises plans to fix hip tomorrow.  Also aware of finger injury.  Patient will be admitted to hospitalist service, preoperative lab work is pending at the time of admission.  ____________________________________________   FINAL CLINICAL IMPRESSION(S) /  ED DIAGNOSES  Final diagnoses:  Pre-op evaluation  Acute closed left  hip fracture Phalanx fracture suspected, right proximal and middle digit     Note:  This document was prepared using Dragon voice recognition software and may include unintentional dictation errors       Eric Kitten, MD 09/19/18 1503         Electronically signed by Eric Kitten, MD at 09/19/2018 3:03 PM   ED to Hosp-Admission (Discharged) on 09/19/2018     Detailed Report

## 2018-12-26 ENCOUNTER — Other Ambulatory Visit: Payer: Self-pay

## 2018-12-26 ENCOUNTER — Encounter: Payer: Self-pay | Admitting: *Deleted

## 2018-12-26 ENCOUNTER — Encounter
Admission: RE | Disposition: A | Discharge status: Home / Self Care | Payer: Self-pay | Source: Home / Self Care | Attending: Orthopedic Surgery

## 2018-12-26 ENCOUNTER — Ambulatory Visit: Payer: Worker's Compensation | Admitting: Anesthesiology

## 2018-12-26 ENCOUNTER — Ambulatory Visit
Admission: RE | Admit: 2018-12-26 | Discharge: 2018-12-26 | Disposition: A | Discharge status: Home / Self Care | Payer: Worker's Compensation | Attending: Orthopedic Surgery | Admitting: Orthopedic Surgery

## 2018-12-26 DIAGNOSIS — G4730 Sleep apnea, unspecified: Secondary | ICD-10-CM | POA: Insufficient documentation

## 2018-12-26 DIAGNOSIS — Z79899 Other long term (current) drug therapy: Secondary | ICD-10-CM | POA: Diagnosis not present

## 2018-12-26 DIAGNOSIS — E1129 Type 2 diabetes mellitus with other diabetic kidney complication: Secondary | ICD-10-CM | POA: Insufficient documentation

## 2018-12-26 DIAGNOSIS — I129 Hypertensive chronic kidney disease with stage 1 through stage 4 chronic kidney disease, or unspecified chronic kidney disease: Secondary | ICD-10-CM | POA: Insufficient documentation

## 2018-12-26 DIAGNOSIS — Z96642 Presence of left artificial hip joint: Secondary | ICD-10-CM | POA: Diagnosis not present

## 2018-12-26 DIAGNOSIS — D631 Anemia in chronic kidney disease: Secondary | ICD-10-CM | POA: Diagnosis not present

## 2018-12-26 DIAGNOSIS — N189 Chronic kidney disease, unspecified: Secondary | ICD-10-CM | POA: Insufficient documentation

## 2018-12-26 DIAGNOSIS — Z7984 Long term (current) use of oral hypoglycemic drugs: Secondary | ICD-10-CM | POA: Insufficient documentation

## 2018-12-26 DIAGNOSIS — G5621 Lesion of ulnar nerve, right upper limb: Secondary | ICD-10-CM | POA: Diagnosis not present

## 2018-12-26 LAB — GLUCOSE, CAPILLARY: Glucose-Capillary: 115 mg/dL — ABNORMAL HIGH (ref 70–99)

## 2018-12-26 LAB — POCT I-STAT, CHEM 8
BUN: 44 mg/dL — ABNORMAL HIGH (ref 8–23)
Calcium, Ion: 1.16 mmol/L (ref 1.15–1.40)
Chloride: 107 mmol/L (ref 98–111)
Creatinine, Ser: 2.8 mg/dL — ABNORMAL HIGH (ref 0.61–1.24)
Glucose, Bld: 134 mg/dL — ABNORMAL HIGH (ref 70–99)
HCT: 32 % — ABNORMAL LOW (ref 39.0–52.0)
Hemoglobin: 10.9 g/dL — ABNORMAL LOW (ref 13.0–17.0)
Potassium: 3.5 mmol/L (ref 3.5–5.1)
Sodium: 144 mmol/L (ref 135–145)
TCO2: 24 mmol/L (ref 22–32)

## 2018-12-26 SURGERY — DECOMPRESSION, GUYON'S CANAL
Anesthesia: General | Site: Wrist | Laterality: Right

## 2018-12-26 MED ORDER — ONDANSETRON HCL 4 MG/2ML IJ SOLN
INTRAMUSCULAR | Status: AC
  Filled 2018-12-26: qty 2

## 2018-12-26 MED ORDER — GLYCOPYRROLATE 0.2 MG/ML IJ SOLN
INTRAMUSCULAR | Status: AC
  Filled 2018-12-26: qty 1

## 2018-12-26 MED ORDER — LIDOCAINE 2% (20 MG/ML) 5 ML SYRINGE
INTRAMUSCULAR | Status: DC | PRN
  Administered 2018-12-26: 50 mg via INTRAVENOUS

## 2018-12-26 MED ORDER — FAMOTIDINE 20 MG PO TABS
ORAL_TABLET | ORAL | Status: AC
  Administered 2018-12-26: 20 mg via ORAL
  Filled 2018-12-26: qty 1

## 2018-12-26 MED ORDER — HYDROCODONE-ACETAMINOPHEN 5-325 MG PO TABS: 1.0000 | ORAL_TABLET | Freq: Four times a day (QID) | ORAL | 0 refills | Status: DC | PRN

## 2018-12-26 MED ORDER — FENTANYL CITRATE (PF) 100 MCG/2ML IJ SOLN
INTRAMUSCULAR | Status: AC
  Filled 2018-12-26: qty 2

## 2018-12-26 MED ORDER — DEXAMETHASONE SODIUM PHOSPHATE 10 MG/ML IJ SOLN
INTRAMUSCULAR | Status: AC
  Filled 2018-12-26: qty 1

## 2018-12-26 MED ORDER — FAMOTIDINE 20 MG PO TABS
20.0000 mg | ORAL_TABLET | Freq: Once | ORAL | Status: AC
  Administered 2018-12-26: 11:00:00 20 mg via ORAL

## 2018-12-26 MED ORDER — LIDOCAINE HCL (PF) 2 % IJ SOLN
INTRAMUSCULAR | Status: AC
  Filled 2018-12-26: qty 10

## 2018-12-26 MED ORDER — DEXAMETHASONE SODIUM PHOSPHATE 10 MG/ML IJ SOLN
INTRAMUSCULAR | Status: DC | PRN
  Administered 2018-12-26: 10 mg via INTRAVENOUS

## 2018-12-26 MED ORDER — GLYCOPYRROLATE 0.2 MG/ML IJ SOLN
INTRAMUSCULAR | Status: DC | PRN
  Administered 2018-12-26: 0.2 mg via INTRAVENOUS

## 2018-12-26 MED ORDER — ONDANSETRON HCL 4 MG/2ML IJ SOLN: 4.0000 mg | Freq: Once | INTRAMUSCULAR | Status: DC | PRN

## 2018-12-26 MED ORDER — MIDAZOLAM HCL 5 MG/5ML IJ SOLN
INTRAMUSCULAR | Status: DC | PRN
  Administered 2018-12-26: 2 mg via INTRAVENOUS

## 2018-12-26 MED ORDER — FENTANYL CITRATE (PF) 100 MCG/2ML IJ SOLN
INTRAMUSCULAR | Status: DC | PRN
  Administered 2018-12-26 (×2): 50 ug via INTRAVENOUS

## 2018-12-26 MED ORDER — BUPIVACAINE HCL (PF) 0.5 % IJ SOLN
INTRAMUSCULAR | Status: DC | PRN
  Administered 2018-12-26: 10 mL

## 2018-12-26 MED ORDER — ONDANSETRON HCL 4 MG/2ML IJ SOLN
INTRAMUSCULAR | Status: DC | PRN
  Administered 2018-12-26: 4 mg via INTRAVENOUS

## 2018-12-26 MED ORDER — CEFAZOLIN SODIUM-DEXTROSE 2-4 GM/100ML-% IV SOLN
INTRAVENOUS | Status: AC
  Filled 2018-12-26: qty 100

## 2018-12-26 MED ORDER — SODIUM CHLORIDE 0.9 % IV SOLN
INTRAVENOUS | Status: DC
  Administered 2018-12-26: 11:00:00 via INTRAVENOUS

## 2018-12-26 MED ORDER — PROPOFOL 10 MG/ML IV BOLUS
INTRAVENOUS | Status: DC | PRN
  Administered 2018-12-26: 100 mg via INTRAVENOUS

## 2018-12-26 MED ORDER — PROPOFOL 500 MG/50ML IV EMUL
INTRAVENOUS | Status: AC
  Filled 2018-12-26: qty 50

## 2018-12-26 MED ORDER — FENTANYL CITRATE (PF) 100 MCG/2ML IJ SOLN: 25.0000 ug | INTRAMUSCULAR | Status: DC | PRN

## 2018-12-26 MED ORDER — MIDAZOLAM HCL 2 MG/2ML IJ SOLN
INTRAMUSCULAR | Status: AC
  Filled 2018-12-26: qty 2

## 2018-12-26 MED ORDER — LIDOCAINE HCL (PF) 0.5 % IJ SOLN
INTRAMUSCULAR | Status: AC
  Filled 2018-12-26: qty 50

## 2018-12-26 SURGICAL SUPPLY — 27 items
APL PRP STRL LF DISP 70% ISPRP (MISCELLANEOUS) ×1
BNDG ELASTIC 3X5.8 VLCR STR LF (GAUZE/BANDAGES/DRESSINGS) ×3 IMPLANT
BNDG ESMARK 4X12 TAN STRL LF (GAUZE/BANDAGES/DRESSINGS) ×3 IMPLANT
CANISTER SUCT 1200ML W/VALVE (MISCELLANEOUS) ×3 IMPLANT
CHLORAPREP W/TINT 26 (MISCELLANEOUS) ×3 IMPLANT
COVER WAND RF STERILE (DRAPES) ×3 IMPLANT
CUFF TOURN 18 STER (MISCELLANEOUS) ×2 IMPLANT
ELECT CAUTERY NDL 2.0 MIC (NEEDLE) ×1 IMPLANT
ELECT CAUTERY NEEDLE 2.0 MIC (NEEDLE) ×3 IMPLANT
ELECT CAUTERY NEEDLE TIP 1.0 (MISCELLANEOUS) ×3
ELECTRODE CAUTERY NEDL TIP 1.0 (MISCELLANEOUS) ×1 IMPLANT
GAUZE SPONGE 4X4 12PLY STRL (GAUZE/BANDAGES/DRESSINGS) ×3 IMPLANT
GAUZE XEROFORM 1X8 LF (GAUZE/BANDAGES/DRESSINGS) ×3 IMPLANT
GLOVE BIOGEL PI IND STRL 9 (GLOVE) ×1 IMPLANT
GLOVE BIOGEL PI INDICATOR 9 (GLOVE) ×2
GLOVE SURG ORTHO 9.0 STRL STRW (GLOVE) ×3 IMPLANT
GOWN SRG 2XL LVL 4 RGLN SLV (GOWNS) ×1 IMPLANT
GOWN STRL NON-REIN 2XL LVL4 (GOWNS) ×3
GOWN STRL REUS W/ TWL LRG LVL3 (GOWN DISPOSABLE) ×1 IMPLANT
GOWN STRL REUS W/TWL LRG LVL3 (GOWN DISPOSABLE) ×3
KIT TURNOVER KIT A (KITS) ×3 IMPLANT
NS IRRIG 500ML POUR BTL (IV SOLUTION) ×3 IMPLANT
PACK EXTREMITY ARMC (MISCELLANEOUS) ×3 IMPLANT
PAD CAST CTTN 4X4 STRL (SOFTGOODS) ×1 IMPLANT
PADDING CAST COTTON 4X4 STRL (SOFTGOODS) ×3
STOCKINETTE 48X4 2 PLY STRL (GAUZE/BANDAGES/DRESSINGS) ×1 IMPLANT
STOCKINETTE STRL 4IN 9604848 (GAUZE/BANDAGES/DRESSINGS) ×3 IMPLANT

## 2018-12-26 NOTE — Anesthesia Procedure Notes (Signed)
Procedure Name: LMA Insertion Performed by: Lillyian Heidt, CRNA Pre-anesthesia Checklist: Patient identified, Patient being monitored, Timeout performed, Emergency Drugs available and Suction available Patient Re-evaluated:Patient Re-evaluated prior to induction Oxygen Delivery Method: Circle system utilized Preoxygenation: Pre-oxygenation with 100% oxygen Induction Type: IV induction Ventilation: Mask ventilation without difficulty LMA: LMA inserted LMA Size: 5.0 Tube type: Oral Number of attempts: 1 Placement Confirmation: positive ETCO2 and breath sounds checked- equal and bilateral Tube secured with: Tape Dental Injury: Teeth and Oropharynx as per pre-operative assessment        

## 2018-12-26 NOTE — Op Note (Signed)
12/26/2018  11:46 AM  PATIENT:  Eric Russell  63 y.o. male  PRE-OPERATIVE DIAGNOSIS:  NEURITIS OF RIGHT ULNAR NERVE  POST-OPERATIVE DIAGNOSIS:  NEURITIS OF RIGHT ULNAR NERVE  PROCEDURE:  Procedure(s): GUYON'S CANAL RELEASE (Right)  SURGEON: Laurene Footman, MD  ASSISTANTS: None  ANESTHESIA:   general  EBL:  Total I/O In: 400 [I.V.:400] Out: -   BLOOD ADMINISTERED:none  DRAINS: none   LOCAL MEDICATIONS USED:  MARCAINE     SPECIMEN:  No Specimen  DISPOSITION OF SPECIMEN:  N/A  COUNTS:  YES  TOURNIQUET:   Total Tourniquet Time Documented: Upper Arm (Right) - 15 minutes Total: Upper Arm (Right) - 15 minutes   IMPLANTS: None  DICTATION: .Dragon Dictation patient was brought to the operating room and an attempt was made at a Bier block but was unsuccessful so patient was placed under general anesthesia in the right arm prepped and draped in the usual sterile fashion.  After patient identification and timeout procedures were completed tourniquet was raised.  An incision over the Guyon's canal was made approximately 2 and half centimeters in length and subcutaneous tissue spread.  The ulnar was then identified and it appeared to have herniated and up through the Guyon's canal in its midportion.  Protecting this going distally the nerve was decompressed distally until there was no further compression splitting some muscle.  Going more proximally it appeared that this did appear to be the area of compression and the incision was extended proximal across the wrist crease to get adequate exposure and after release proximally it appeared there was no further compression of the nerve.  The wound was then infiltrated with 10 cc of half percent Sensorcaine without epinephrine to aid in postop analgesia and the wound irrigated.  The wound was then closed with 4-0 nylon in a simple interrupted fashion.  Xeroform 4 x 4 web roll and Ace wrap applied and tourniquet let down  PLAN OF CARE:  Discharge to home after PACU  PATIENT DISPOSITION:  PACU - hemodynamically stable.

## 2018-12-26 NOTE — H&P (Signed)
Reviewed paper H+P, will be scanned into chart. No changes noted.  

## 2018-12-26 NOTE — Anesthesia Preprocedure Evaluation (Addendum)
Anesthesia Evaluation  Patient identified by MRN, date of birth, ID band Patient awake    Reviewed: Allergy & Precautions, H&P , NPO status , Patient's Chart, lab work & pertinent test results, reviewed documented beta blocker date and time   History of Anesthesia Complications (+) history of anesthetic complications  Airway Mallampati: II  TM Distance: >3 FB Neck ROM: full    Dental no notable dental hx. (+) Teeth Intact   Pulmonary neg pulmonary ROS, sleep apnea ,    Pulmonary exam normal breath sounds clear to auscultation       Cardiovascular Exercise Tolerance: Good hypertension, On Medications negative cardio ROS Normal cardiovascular exam Rhythm:regular Rate:Normal     Neuro/Psych negative neurological ROS  negative psych ROS   GI/Hepatic negative GI ROS, Neg liver ROS,   Endo/Other  negative endocrine ROSdiabetes  Renal/GU Renal diseasenegative Renal ROS  negative genitourinary   Musculoskeletal   Abdominal   Peds  Hematology negative hematology ROS (+) Blood dyscrasia, anemia ,   Anesthesia Other Findings   Reproductive/Obstetrics negative OB ROS                            Anesthesia Physical Anesthesia Plan  ASA: III  Anesthesia Plan: Bier Block and Bier Block-Lidocaine Only   Post-op Pain Management:    Induction:   PONV Risk Score and Plan:   Airway Management Planned:   Additional Equipment:   Intra-op Plan:   Post-operative Plan:   Informed Consent: I have reviewed the patients History and Physical, chart, labs and discussed the procedure including the risks, benefits and alternatives for the proposed anesthesia with the patient or authorized representative who has indicated his/her understanding and acceptance.       Plan Discussed with: CRNA  Anesthesia Plan Comments:        Anesthesia Quick Evaluation

## 2018-12-26 NOTE — Discharge Instructions (Signed)
AMBULATORY SURGERY  DISCHARGE INSTRUCTIONS   1) The drugs that you were given will stay in your system until tomorrow so for the next 24 hours you should not:  A) Drive an automobile B) Make any legal decisions C) Drink any alcoholic beverage   2) You may resume regular meals tomorrow.  Today it is better to start with liquids and gradually work up to solid foods.  You may eat anything you prefer, but it is better to start with liquids, then soup and crackers, and gradually work up to solid foods.   3) Please notify your doctor immediately if you have any unusual bleeding, trouble breathing, redness and pain at the surgery site, drainage, fever, or pain not relieved by medication.    4) Additional Instructions:        Please contact your physician with any problems or Same Day Surgery at [phone], Monday through Friday 6 am to 4 pm, or [place] at [facility] number at [phone].Loosen Ace wrap prior to dismissal and if your fingers swell but leave the underlying cotton wrap in place.  Numbing medicine was placed during surgery still be concerned about numbness in the hand today.  Work on finger range of motion is much as you can.  Keep arm elevated to minimize swelling.  Call office if you are having problems.

## 2018-12-26 NOTE — Anesthesia Post-op Follow-up Note (Signed)
Anesthesia QCDR form completed.        

## 2018-12-26 NOTE — Transfer of Care (Signed)
Immediate Anesthesia Transfer of Care Note  Patient: Eric Russell  Procedure(s) Performed: GUYON'S CANAL RELEASE (Right Wrist)  Patient Location: PACU  Anesthesia Type:General  Level of Consciousness: awake and alert   Airway & Oxygen Therapy: Patient Spontanous Breathing and Patient connected to face mask oxygen  Post-op Assessment: Report given to RN and Post -op Vital signs reviewed and stable  Post vital signs: Reviewed  Last Vitals:  Vitals Value Taken Time  BP 104/58 12/26/18 1152  Temp 36.8 C 12/26/18 1150  Pulse 56 12/26/18 1152  Resp 19 12/26/18 1152  SpO2 100 % 12/26/18 1152  Vitals shown include unvalidated device data.  Last Pain:  Vitals:   12/26/18 1027  TempSrc: Tympanic  PainSc: 0-No pain         Complications: No apparent anesthesia complications

## 2019-01-02 ENCOUNTER — Encounter: Payer: Self-pay | Admitting: Orthopedic Surgery

## 2019-01-03 NOTE — H&P (Signed)
Chief Complaint  Patient presents with  . Left Hip - Post Operative Visit   History of the Present Illness: Eric Russell is a 63 y.o. male here for follow-up of left total hip arthroplasty for a femoral neck fracture that he sustained while working at his job. His date of surgery was 09/21/2018. This is a workers' compensation injury. He returns today for repeat evaluation and follow-up x-rays. At his last visit on 10/11/2018, he was given a note to be out an additional 4 weeks until today. Today, we will see if he is able to resume light duty. He works as a Freight forwarder at a [employer].  The patient saw Rachelle Hora, PA-C on 10/09/2018 and was given a brace for his right wrist and 5th digit numbness that developed due to the injury. He feels that his brace is working and has improvement of the numbness, but still has right wrist and hand symptoms.   I have reviewed past medical, surgical, social and family history, and allergies as documented in the EMR.  Past Medical History: Past Medical History:  Diagnosis Date  . DM (diabetes mellitus) type II controlled with renal manifestation (CMS-HCC) 12/26/2013  . Essential hypertension, benign  . Hypersomnia with sleep apnea, unspecified  . Sleep apnea  on auto CPAP  . Type II or unspecified type diabetes mellitus without mention of complication, not stated as uncontrolled (CMS-HCC)   Past Surgical History: Past Surgical History:  Procedure Laterality Date  . ARTHROPLASTY TOTAL KNEE Right 06/29/2016  Dr.Dave Mannes  . CHOLECYSTECTOMY 1996   Past Family History: Family History  Problem Relation Age of Onset  . Colon cancer Father  . High blood pressure (Hypertension) Father  . Diabetes Father   Medications: Current Outpatient Medications Ordered in Epic  Medication Sig Dispense Refill  . amLODIPine (NORVASC) 10 MG tablet Take 1 tablet (10 mg total) by mouth once daily 90 tablet 4  . blood glucose diagnostic (ONETOUCH ULTRA TEST) test strip  Use 1 each 2 (two) times daily. Use as instructed.  . carvedilol (COREG) 25 MG tablet TAKE 2 TABLETS TWICE A DAY WITH MEALS 360 tablet 4  . ferrous gluconate (FERGON) 324 MG tablet Take 1 tablet (324 mg total) by mouth daily with breakfast 30 tablet 11  . FUROsemide (LASIX) 20 MG tablet TAKE ONE-HALF TABLET (10 MG) DAILY 45 tablet 4  . hydrALAZINE (APRESOLINE) 50 MG tablet Take 1 tablet (50 mg total) by mouth 2 (two) times daily 180 tablet 4  . metFORMIN (GLUCOPHAGE) 500 MG tablet TAKE 1 TABLET TWICE DAILY 180 tablet 4  . multivitamin (THERA) tablet Take 1 tablet by mouth once daily.   . potassium chloride (K-DUR,KLOR-CON) 20 MEQ ER tablet TAKE 1 TABLET TWICE A DAY 180 tablet 3  . rivastigmine tartrate (EXELON) 3 MG capsule Take 1 capsule (3 mg total) by mouth 2 (two) times daily with meals 60 capsule 11  . simvastatin (ZOCOR) 10 MG tablet Take 1 tablet (10 mg total) by mouth once daily 90 tablet 3  . triamterene-hydrochlorothiazide (MAXZIDE) 75-50 mg tablet TAKE 1 TABLET DAILY 90 tablet 4   No current Epic-ordered facility-administered medications on file.   Allergies: Allergies  Allergen Reactions  . Tramadol Other (See Comments)  Cant sleep, irritable, hiccups per patient    Body mass index is 36.49 kg/m.  Review of Systems: A comprehensive 14 point ROS was performed, reviewed, and the pertinent orthopaedic findings are documented in the HPI.  Vitals:  11/06/18 1404  BP:  138/74   General Physical Examination:  General/Constitutional: No apparent distress: well-nourished and well developed. Eyes: Pupils equal, round with synchronous movement. Lungs: Clear to auscultation HEENT: Normal Vascular: No edema, swelling or tenderness, except as noted in detailed exam. Cardiac: Heart rate and rhythm is regular. Integumentary: No impressive skin lesions present, except as noted in detailed exam. Neuro/Psych: Normal mood and affect, oriented to person, place and  time.  Musculoskeletal Examination: On exam, the patient is walking with an antalgic gait. He has a well-healed scar over the anterior left hip.   He is able to flex and extend the fingers of his right hand. He appears to have weakness probably at the wrist consistent with possible ulnar neuritis.   Radiographs: AP and lateral x-rays of the left hip were ordered and personally reviewed today. These show well aligned prosthesis. There appears to be bony ingrowth on both sides of the joint. There is no evidence of any migration of the implant. Leg length and offset appear appropriate. There is incidental finding of significant right hip osteoarthritis.   Assessment: ICD-10-CM  1. 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 The patient was instructed to do anterior hip stretching exercises to help with anterior hip discomfort and to stretch scar tissue. These exercises were demonstrated to him in clinic today. He was advised to massage his scar with lotion or vitamin E oil.     EMG nerve conduction test showed significant compression with severe ulnar neuritis distal forearm plan is for ulnar nerve release at the wrist.  Risk benefits possible complications were all discussed.  Scribe Attestation: I, Candie Mile, am acting as scribe for [employer], MD

## 2019-01-09 NOTE — Anesthesia Postprocedure Evaluation (Signed)
Anesthesia Post Note  Patient: Eric Russell  Procedure(s) Performed: GUYON'S CANAL RELEASE (Right Wrist)  Patient location during evaluation: PACU Anesthesia Type: General Level of consciousness: awake and alert Pain management: pain level controlled Vital Signs Assessment: post-procedure vital signs reviewed and stable Respiratory status: spontaneous breathing, nonlabored ventilation, respiratory function stable and patient connected to nasal cannula oxygen Cardiovascular status: blood pressure returned to baseline and stable Postop Assessment: no apparent nausea or vomiting Anesthetic complications: no     Last Vitals:  Vitals:   12/26/18 1220 12/26/18 1230  BP: (!) 150/75 (!) 155/74  Pulse: (!) 48 (!) 57  Resp: 14 18  Temp:  (!) 36.1 C  SpO2: 96% 98%    Last Pain:  Vitals:   12/27/18 0823  TempSrc:   PainSc: 0-No pain                 Molli Barrows

## 2019-06-22 DIAGNOSIS — D509 Iron deficiency anemia, unspecified: Secondary | ICD-10-CM | POA: Insufficient documentation

## 2019-06-22 NOTE — Progress Notes (Signed)
[place]  [phone] [phone]) [phone]  ID: LAZARUS SUDBURY OB: 03-22-1956  MR#: [id number]  NGE#:[id number]  Patient Care Team: Rusty Aus, MD as PCP - General (Internal Medicine)  CHIEF COMPLAINT: Anemia secondary to chronic renal insufficiency.  INTERVAL HISTORY: Patient is a 64 year old male with significantly reduced GFR who was also noted to have persistently decreased hemoglobin.  He has chronic weakness and fatigue, but otherwise feels well.  He has no neurologic complaints.  He denies any recent fevers or illnesses.  He has a good appetite and denies weight loss.  He has no chest pain, shortness of breath, cough, or hemoptysis.  He denies any nausea, vomiting, constipation, or diarrhea.  He has no melena or hematochezia.  He has no urinary complaints.  Patient feels at his baseline and offers no further specific complaints today.  REVIEW OF SYSTEMS:   Review of Systems  Constitutional: Positive for malaise/fatigue. Negative for fever and weight loss.  Respiratory: Negative.  Negative for cough, hemoptysis and shortness of breath.   Cardiovascular: Negative.  Negative for chest pain and leg swelling.  Gastrointestinal: Negative.  Negative for abdominal pain, blood in stool and melena.  Genitourinary: Negative.  Negative for hematuria.  Musculoskeletal: Negative.  Negative for back pain.  Skin: Negative.  Negative for rash.  Neurological: Positive for weakness. Negative for dizziness, focal weakness and headaches.  Psychiatric/Behavioral: Negative.  The patient is not nervous/anxious.     As per HPI. Otherwise, a complete review of systems is negative.  PAST MEDICAL HISTORY: Past Medical History:  Diagnosis Date  . Anemia   . Arthritis   . Cancer [facility])    prostate  . Chronic kidney disease   . Complication of anesthesia    stopped breathing during gallbladder and knee surgery  . Diabetes mellitus without complication ([place])   .  Hypertension   . Sleep apnea    uses CPAP    PAST SURGICAL HISTORY: Past Surgical History:  Procedure Laterality Date  . CHOLECYSTECTOMY    . MOHS SURGERY    . TOTAL HIP ARTHROPLASTY Left 09/21/2018   Procedure: TOTAL HIP ARTHROPLASTY ANTERIOR APPROACH;  Surgeon: Hessie Knows, MD;  Location: [facility] ORS;  Service: Orthopedics;  Laterality: Left;  . TOTAL KNEE ARTHROPLASTY Right 06/29/2016   Procedure: TOTAL KNEE ARTHROPLASTY;  Surgeon: Hessie Knows, MD;  Location: [facility] ORS;  Service: Orthopedics;  Laterality: Right;    FAMILY HISTORY: History reviewed. No pertinent family history.  ADVANCED DIRECTIVES (Y/N):  N  HEALTH MAINTENANCE: Social History   Tobacco Use  . Smoking status: Never Smoker  . Smokeless tobacco: Never Used  Substance Use Topics  . Alcohol use: No  . Drug use: No     Colonoscopy:  PAP:  Bone density:  Lipid panel:  Allergies  Allergen Reactions  . Tramadol Nausea And Vomiting and Other (See Comments)    Hiccups, tachycardia Cant sleep, irritable, hiccups per patient   Cant sleep, irritable, hiccups per patient       Current Outpatient Medications  Medication Sig Dispense Refill  . acetaminophen (TYLENOL) 650 MG CR tablet Take 650 mg by mouth every 8 (eight) hours as needed for pain.    Marland Kitchen amLODipine (NORVASC) 5 MG tablet Take 5 mg by mouth daily.    . carvedilol (COREG) 25 MG tablet Take 50 mg by mouth 2 (two) times daily with a meal.    . ferrous gluconate (FERGON) 324 MG tablet Take 1 tablet by mouth daily.    Marland Kitchen  furosemide (LASIX) 20 MG tablet Take 20 mg by mouth daily as needed.     Marland Kitchen glimepiride (AMARYL) 2 MG tablet Take 1.5 mg by mouth daily with breakfast.     . levocetirizine (XYZAL) 5 MG tablet Take 5 mg by mouth every evening.    . montelukast (SINGULAIR) 10 MG tablet Take 10 mg by mouth every morning.    . Multiple Vitamin (THERA) TABS Take 1 tablet by mouth daily.    . potassium chloride SA (KLOR-CON) 20 MEQ tablet Take 1 tablet by mouth  daily.    . rivastigmine (EXELON) 3 MG capsule Take 3 mg by mouth 2 (two) times daily.    . simvastatin (ZOCOR) 10 MG tablet Take 10 mg by mouth every morning.     . triamterene-hydrochlorothiazide (MAXZIDE) 75-50 MG tablet Take 1 tablet by mouth every morning.      No current facility-administered medications for this visit.    OBJECTIVE: Vitals:   06/29/19 1119  BP: (!) 188/80  Pulse: (!) 50  Resp: 20  Temp: (!) 95.7 F (35.4 C)  SpO2: 99%     Body mass index is 35.97 kg/m.    ECOG FS:0 - Asymptomatic  General: Well-developed, well-nourished, no acute distress. Eyes: Pink conjunctiva, anicteric sclera. HEENT: Normocephalic, moist mucous membranes. Lungs: No audible wheezing or coughing. Heart: Regular rate and rhythm. Abdomen: Soft, nontender, no obvious distention. Musculoskeletal: No edema, cyanosis, or clubbing. Neuro: Alert, answering all questions appropriately. Cranial nerves grossly intact. Skin: No rashes or petechiae noted. Psych: Normal affect. Lymphatics: No cervical, calvicular, axillary or inguinal LAD.   LAB RESULTS:  Lab Results  Component Value Date   NA 144 12/26/2018   K 3.5 12/26/2018   CL 107 12/26/2018   CO2 26 09/23/2018   GLUCOSE 134 (H) 12/26/2018   BUN 44 (H) 12/26/2018   CREATININE 2.80 (H) 12/26/2018   CALCIUM 8.7 (L) 09/23/2018   GFRNONAA 22 (L) 09/23/2018   GFRAA 26 (L) 09/23/2018    Lab Results  Component Value Date   WBC 6.5 06/29/2019   HGB 11.1 (L) 06/29/2019   HCT 31.6 (L) 06/29/2019   MCV 86.6 06/29/2019   PLT 230 06/29/2019     STUDIES: No results found.  ASSESSMENT: Anemia secondary to chronic renal insufficiency.   PLAN:    1.  Anemia secondary to chronic renal insufficiency: Patient's hemoglobin has improved to 11.1.  He has no evidence of hemolysis and his reticulocyte count is appropriately within normal limits.  The remainder of his laboratory work including iron panel, B12, and folate are pending at time of  dictation.  Patient does not require Retacrit at this time, but if his hemoglobin falls below 10.0 would consider treatment at that point.  No intervention is needed at this time.  Patient will have video assisted telemedicine visit in 1 week to discuss his results. 2.  Hypertension: Blood pressure significantly elevated today.  Continue monitoring and treatment per primary care. 3.  Chronic renal insufficiency: Patient has an appointment with nephrology in approximately 2 weeks.  Patient expressed understanding and was in agreement with this plan. He also understands that He can call clinic at any time with any questions, concerns, or complaints.    Lloyd Huger, MD   06/29/2019 1:05 PM

## 2019-06-29 ENCOUNTER — Telehealth: Payer: Self-pay | Admitting: Emergency Medicine

## 2019-06-29 ENCOUNTER — Inpatient Hospital Stay: Payer: BC Managed Care – PPO | Attending: Oncology | Admitting: Oncology

## 2019-06-29 ENCOUNTER — Inpatient Hospital Stay: Payer: BC Managed Care – PPO

## 2019-06-29 ENCOUNTER — Encounter: Payer: Self-pay | Admitting: Oncology

## 2019-06-29 VITALS — BP 188/80 | HR 50 | Temp 95.7°F | Resp 20 | Wt 236.6 lb

## 2019-06-29 DIAGNOSIS — N189 Chronic kidney disease, unspecified: Secondary | ICD-10-CM | POA: Diagnosis not present

## 2019-06-29 DIAGNOSIS — G4730 Sleep apnea, unspecified: Secondary | ICD-10-CM | POA: Diagnosis not present

## 2019-06-29 DIAGNOSIS — R5381 Other malaise: Secondary | ICD-10-CM | POA: Insufficient documentation

## 2019-06-29 DIAGNOSIS — D631 Anemia in chronic kidney disease: Secondary | ICD-10-CM

## 2019-06-29 DIAGNOSIS — I129 Hypertensive chronic kidney disease with stage 1 through stage 4 chronic kidney disease, or unspecified chronic kidney disease: Secondary | ICD-10-CM | POA: Insufficient documentation

## 2019-06-29 DIAGNOSIS — Z79899 Other long term (current) drug therapy: Secondary | ICD-10-CM | POA: Diagnosis not present

## 2019-06-29 DIAGNOSIS — Z9989 Dependence on other enabling machines and devices: Secondary | ICD-10-CM | POA: Diagnosis not present

## 2019-06-29 DIAGNOSIS — E1122 Type 2 diabetes mellitus with diabetic chronic kidney disease: Secondary | ICD-10-CM | POA: Insufficient documentation

## 2019-06-29 DIAGNOSIS — M1990 Unspecified osteoarthritis, unspecified site: Secondary | ICD-10-CM | POA: Insufficient documentation

## 2019-06-29 DIAGNOSIS — Z7984 Long term (current) use of oral hypoglycemic drugs: Secondary | ICD-10-CM | POA: Insufficient documentation

## 2019-06-29 DIAGNOSIS — R5383 Other fatigue: Secondary | ICD-10-CM | POA: Insufficient documentation

## 2019-06-29 DIAGNOSIS — R531 Weakness: Secondary | ICD-10-CM | POA: Diagnosis not present

## 2019-06-29 LAB — CBC
HCT: 31.6 % — ABNORMAL LOW (ref 39.0–52.0)
Hemoglobin: 11.1 g/dL — ABNORMAL LOW (ref 13.0–17.0)
MCH: 30.4 pg (ref 26.0–34.0)
MCHC: 35.1 g/dL (ref 30.0–36.0)
MCV: 86.6 fL (ref 80.0–100.0)
Platelets: 230 10*3/uL (ref 150–400)
RBC: 3.65 MIL/uL — ABNORMAL LOW (ref 4.22–5.81)
RDW: 12.7 % (ref 11.5–15.5)
WBC: 6.5 10*3/uL (ref 4.0–10.5)
nRBC: 0 % (ref 0.0–0.2)

## 2019-06-29 LAB — IRON AND TIBC
Iron: 83 ug/dL (ref 45–182)
Saturation Ratios: 25 % (ref 17.9–39.5)
TIBC: 332 ug/dL (ref 250–450)
UIBC: 249 ug/dL

## 2019-06-29 LAB — DAT, POLYSPECIFIC AHG (ARMC ONLY): Polyspecific AHG test: NEGATIVE

## 2019-06-29 LAB — FERRITIN: Ferritin: 72 ng/mL (ref 24–336)

## 2019-06-29 LAB — RETICULOCYTES
Immature Retic Fract: 4.8 % (ref 2.3–15.9)
RBC.: 3.65 MIL/uL — ABNORMAL LOW (ref 4.22–5.81)
Retic Count, Absolute: 36.9 10*3/uL (ref 19.0–186.0)
Retic Ct Pct: 1 % (ref 0.4–3.1)

## 2019-06-29 LAB — LACTATE DEHYDROGENASE: LDH: 189 U/L (ref 98–192)

## 2019-06-29 LAB — VITAMIN B12: Vitamin B-12: 581 pg/mL (ref 180–914)

## 2019-06-29 LAB — FOLATE: Folate: 37 ng/mL (ref >5.9)

## 2019-06-29 NOTE — Progress Notes (Signed)
Patient has no concerns. Just has lack of energy.

## 2019-06-29 NOTE — Telephone Encounter (Signed)
Called pt to let him know results of his hgb from today and that he will not need retacrit when he comes back to see MD, and to find out if he can do a virtual visit as he does not have a MyChart set up. No answer, message left.

## 2019-06-30 LAB — HAPTOGLOBIN: Haptoglobin: 159 mg/dL (ref 32–363)

## 2019-06-30 LAB — ERYTHROPOIETIN: Erythropoietin: 5.2 m[IU]/mL (ref 2.6–18.5)

## 2019-06-30 NOTE — Progress Notes (Signed)
[place]  [phone]) [phone] [phone]) [phone]  ID: [id number] MCCARRY OB: 1956/01/15  MR#: [id number]  BPZ#:[id number]  Patient Care Team: Rusty Aus, MD as PCP - General (Internal Medicine)  I connected with Golda Acre on 07/05/19 at 10:15 AM EDT by video enabled telemedicine visit and verified that I am speaking with the correct person using two identifiers.   I discussed the limitations, risks, security and privacy concerns of performing an evaluation and management service by telemedicine and the availability of in-person appointments. I also discussed with the patient that there may be a patient responsible charge related to this service. The patient expressed understanding and agreed to proceed.   Other persons participating in the visit and their role in the encounter: Patient, MD.  Patient's location: Home. Provider's location: Clinic.  CHIEF COMPLAINT: Anemia secondary to chronic renal insufficiency.  INTERVAL HISTORY: Patient agreed to video assisted telemedicine visit for further evaluation and discussion of his laboratory results.  He continues to have chronic weakness and fatigue, but otherwise feels well.  He has no neurologic complaints.  He denies any recent fevers or illnesses.  He has a good appetite and denies weight loss.  He has no chest pain, shortness of breath, cough, or hemoptysis.  He denies any nausea, vomiting, constipation, or diarrhea.  He has no melena or hematochezia.  He has no urinary complaints.  Patient offers no further specific complaints today.  REVIEW OF SYSTEMS:   Review of Systems  Constitutional: Positive for malaise/fatigue. Negative for fever and weight loss.  Respiratory: Negative.  Negative for cough, hemoptysis and shortness of breath.   Cardiovascular: Negative.  Negative for chest pain and leg swelling.  Gastrointestinal: Negative.  Negative for abdominal pain, blood in stool and melena.  Genitourinary:  Negative.  Negative for hematuria.  Musculoskeletal: Negative.  Negative for back pain.  Skin: Negative.  Negative for rash.  Neurological: Positive for weakness. Negative for dizziness, focal weakness and headaches.  Psychiatric/Behavioral: Negative.  The patient is not nervous/anxious.     As per HPI. Otherwise, a complete review of systems is negative.  PAST MEDICAL HISTORY: Past Medical History:  Diagnosis Date  . Anemia   . Arthritis   . Cancer [facility])    prostate  . Chronic kidney disease   . Complication of anesthesia    stopped breathing during gallbladder and knee surgery  . Diabetes mellitus without complication ([place])   . Hypertension   . Sleep apnea    uses CPAP    PAST SURGICAL HISTORY: Past Surgical History:  Procedure Laterality Date  . CHOLECYSTECTOMY    . MOHS SURGERY    . TOTAL HIP ARTHROPLASTY Left 09/21/2018   Procedure: TOTAL HIP ARTHROPLASTY ANTERIOR APPROACH;  Surgeon: Hessie Knows, MD;  Location: [facility] ORS;  Service: Orthopedics;  Laterality: Left;  . TOTAL KNEE ARTHROPLASTY Right 06/29/2016   Procedure: TOTAL KNEE ARTHROPLASTY;  Surgeon: Hessie Knows, MD;  Location: [facility] ORS;  Service: Orthopedics;  Laterality: Right;    FAMILY HISTORY: History reviewed. No pertinent family history.  ADVANCED DIRECTIVES (Y/N):  N  HEALTH MAINTENANCE: Social History   Tobacco Use  . Smoking status: Never Smoker  . Smokeless tobacco: Never Used  Substance Use Topics  . Alcohol use: No  . Drug use: No     Colonoscopy:  PAP:  Bone density:  Lipid panel:  Allergies  Allergen Reactions  . Tramadol Nausea And Vomiting and Other (See Comments)    Hiccups, tachycardia Cant  sleep, irritable, hiccups per patient   Cant sleep, irritable, hiccups per patient       Current Outpatient Medications  Medication Sig Dispense Refill  . acetaminophen (TYLENOL) 650 MG CR tablet Take 650 mg by mouth every 8 (eight) hours as needed for pain.    Marland Kitchen amLODipine (NORVASC) 5  MG tablet Take 5 mg by mouth daily.    . carvedilol (COREG) 25 MG tablet Take 50 mg by mouth 2 (two) times daily with a meal.    . ferrous gluconate (FERGON) 324 MG tablet Take 1 tablet by mouth daily.    . furosemide (LASIX) 20 MG tablet Take 20 mg by mouth daily as needed.     Marland Kitchen glimepiride (AMARYL) 2 MG tablet Take 1.5 mg by mouth daily with breakfast.     . levocetirizine (XYZAL) 5 MG tablet Take 5 mg by mouth every evening.    . montelukast (SINGULAIR) 10 MG tablet Take 10 mg by mouth every morning.    . Multiple Vitamin (THERA) TABS Take 1 tablet by mouth daily.    . potassium chloride SA (KLOR-CON) 20 MEQ tablet Take 1 tablet by mouth daily.    . rivastigmine (EXELON) 3 MG capsule Take 3 mg by mouth 2 (two) times daily.    . simvastatin (ZOCOR) 10 MG tablet Take 10 mg by mouth every morning.     . triamterene-hydrochlorothiazide (MAXZIDE) 75-50 MG tablet Take 1 tablet by mouth every morning.      No current facility-administered medications for this visit.    OBJECTIVE: Vitals:     There is no height or weight on file to calculate BMI.    ECOG FS:0 - Asymptomatic  General: Well-developed, well-nourished, no acute distress. HEENT: Normocephalic. Neuro: Alert, answering all questions appropriately. Cranial nerves grossly intact. Psych: Normal affect.   LAB RESULTS:  Lab Results  Component Value Date   NA 144 12/26/2018   K 3.5 12/26/2018   CL 107 12/26/2018   CO2 26 09/23/2018   GLUCOSE 134 (H) 12/26/2018   BUN 44 (H) 12/26/2018   CREATININE 2.80 (H) 12/26/2018   CALCIUM 8.7 (L) 09/23/2018   GFRNONAA 22 (L) 09/23/2018   GFRAA 26 (L) 09/23/2018    Lab Results  Component Value Date   WBC 6.5 06/29/2019   HGB 11.1 (L) 06/29/2019   HCT 31.6 (L) 06/29/2019   MCV 86.6 06/29/2019   PLT 230 06/29/2019     STUDIES: No results found.  ASSESSMENT: Anemia secondary to chronic renal insufficiency.   PLAN:    1.  Anemia secondary to chronic renal insufficiency:  Patient's hemoglobin has improved to 11.1.  All of his other laboratory work including iron stores, B12, SPEP, and hemolysis labs are either negative or within normal limits.  He does not require Retacrit at this time, but if his hemoglobin falls below 10.0 would consider treatment at that point.  No intervention is needed at this time.  Return to clinic in 6 months with repeat laboratory work and video assisted telemedicine visit. 2.  Hypertension: Continue monitoring and treatment per primary care. 3.  Chronic renal insufficiency: Patient has an appointment with nephrology in approximately 2 weeks.  I provided 20 minutes of face-to-face video visit time during this encounter which included chart review, counseling, and coordination of care as documented above.   Patient expressed understanding and was in agreement with this plan. He also understands that He can call clinic at any time with any questions, concerns, or complaints.  Lloyd Huger, MD   07/05/2019 12:02 PM

## 2019-07-02 ENCOUNTER — Telehealth: Payer: Self-pay | Admitting: Emergency Medicine

## 2019-07-02 NOTE — Telephone Encounter (Signed)
Patient's wife returned call from Friday as patient is currently at work. Explained to [name] that pt does not need retacrit based on hemoglobin drawn on Friday. [name] verbalized understanding. Asked wife if patient would have the capability to do a virtual visit on 4/1 instead of in clinic visit, she said yes, and confirmed cell phone number in chart for patient is the correct number to text for visit. No further questions or concerns.

## 2019-07-03 LAB — PROTEIN ELECTROPHORESIS, SERUM
A/G Ratio: 1.3 (ref 0.7–1.7)
Albumin ELP: 3.8 g/dL (ref 2.9–4.4)
Alpha-1-Globulin: 0.2 g/dL (ref 0.0–0.4)
Alpha-2-Globulin: 0.8 g/dL (ref 0.4–1.0)
Beta Globulin: 1 g/dL (ref 0.7–1.3)
Gamma Globulin: 1 g/dL (ref 0.4–1.8)
Globulin, Total: 3 g/dL (ref 2.2–3.9)
Total Protein ELP: 6.8 g/dL (ref 6.0–8.5)

## 2019-07-05 ENCOUNTER — Ambulatory Visit: Payer: BC Managed Care – PPO

## 2019-07-05 ENCOUNTER — Encounter: Payer: Self-pay | Admitting: Oncology

## 2019-07-05 ENCOUNTER — Other Ambulatory Visit: Payer: Self-pay

## 2019-07-05 ENCOUNTER — Inpatient Hospital Stay: Payer: BC Managed Care – PPO | Attending: Oncology | Admitting: Oncology

## 2019-07-05 DIAGNOSIS — D631 Anemia in chronic kidney disease: Secondary | ICD-10-CM

## 2019-07-05 DIAGNOSIS — N189 Chronic kidney disease, unspecified: Secondary | ICD-10-CM | POA: Diagnosis not present

## 2019-07-05 NOTE — Progress Notes (Signed)
Pt having virtual visit for follow up to go over lab results and plan of care. No questions or concerns.

## 2019-08-02 ENCOUNTER — Other Ambulatory Visit: Payer: Self-pay | Admitting: Nephrology

## 2019-08-02 DIAGNOSIS — N184 Chronic kidney disease, stage 4 (severe): Secondary | ICD-10-CM

## 2019-08-02 DIAGNOSIS — E1122 Type 2 diabetes mellitus with diabetic chronic kidney disease: Secondary | ICD-10-CM

## 2019-08-10 ENCOUNTER — Ambulatory Visit
Admission: RE | Admit: 2019-08-10 | Discharge: 2019-08-10 | Disposition: A | Discharge status: Home / Self Care | Payer: 59 | Source: Ambulatory Visit | Attending: Nephrology | Admitting: Nephrology

## 2019-08-10 ENCOUNTER — Other Ambulatory Visit: Payer: Self-pay

## 2019-08-10 DIAGNOSIS — E1122 Type 2 diabetes mellitus with diabetic chronic kidney disease: Secondary | ICD-10-CM | POA: Diagnosis present

## 2019-08-10 DIAGNOSIS — N184 Chronic kidney disease, stage 4 (severe): Secondary | ICD-10-CM | POA: Diagnosis present

## 2019-10-28 ENCOUNTER — Emergency Department (HOSPITAL_COMMUNITY): Payer: 59

## 2019-10-28 ENCOUNTER — Emergency Department (HOSPITAL_COMMUNITY)
Admission: EM | Admit: 2019-10-28 | Discharge: 2019-11-04 | Disposition: E | Payer: 59 | Attending: Emergency Medicine | Admitting: Emergency Medicine

## 2019-10-28 ENCOUNTER — Encounter (HOSPITAL_COMMUNITY): Payer: Self-pay | Admitting: Pediatrics

## 2019-10-28 ENCOUNTER — Other Ambulatory Visit: Payer: Self-pay

## 2019-10-28 DIAGNOSIS — R4182 Altered mental status, unspecified: Secondary | ICD-10-CM | POA: Diagnosis present

## 2019-10-28 DIAGNOSIS — Z79899 Other long term (current) drug therapy: Secondary | ICD-10-CM | POA: Diagnosis not present

## 2019-10-28 DIAGNOSIS — I129 Hypertensive chronic kidney disease with stage 1 through stage 4 chronic kidney disease, or unspecified chronic kidney disease: Secondary | ICD-10-CM | POA: Diagnosis not present

## 2019-10-28 DIAGNOSIS — Z66 Do not resuscitate: Secondary | ICD-10-CM | POA: Insufficient documentation

## 2019-10-28 DIAGNOSIS — I629 Nontraumatic intracranial hemorrhage, unspecified: Secondary | ICD-10-CM | POA: Diagnosis not present

## 2019-10-28 DIAGNOSIS — Z7984 Long term (current) use of oral hypoglycemic drugs: Secondary | ICD-10-CM | POA: Diagnosis not present

## 2019-10-28 DIAGNOSIS — I619 Nontraumatic intracerebral hemorrhage, unspecified: Secondary | ICD-10-CM

## 2019-10-28 DIAGNOSIS — N184 Chronic kidney disease, stage 4 (severe): Secondary | ICD-10-CM | POA: Diagnosis not present

## 2019-10-28 DIAGNOSIS — E1122 Type 2 diabetes mellitus with diabetic chronic kidney disease: Secondary | ICD-10-CM | POA: Insufficient documentation

## 2019-10-28 LAB — I-STAT CHEM 8, ED
BUN: 118 mg/dL — ABNORMAL HIGH (ref 8–23)
Calcium, Ion: 1.02 mmol/L — ABNORMAL LOW (ref 1.15–1.40)
Chloride: 101 mmol/L (ref 98–111)
Creatinine, Ser: 6.4 mg/dL — ABNORMAL HIGH (ref 0.61–1.24)
Glucose, Bld: 208 mg/dL — ABNORMAL HIGH (ref 70–99)
HCT: 32 % — ABNORMAL LOW (ref 39.0–52.0)
Hemoglobin: 10.9 g/dL — ABNORMAL LOW (ref 13.0–17.0)
Potassium: 3.3 mmol/L — ABNORMAL LOW (ref 3.5–5.1)
Sodium: 141 mmol/L (ref 135–145)
TCO2: 24 mmol/L (ref 22–32)

## 2019-10-28 LAB — DIFFERENTIAL
Abs Immature Granulocytes: 0.09 10*3/uL — ABNORMAL HIGH (ref 0.00–0.07)
Basophils Absolute: 0.1 10*3/uL (ref 0.0–0.1)
Basophils Relative: 0 %
Eosinophils Absolute: 0.1 10*3/uL (ref 0.0–0.5)
Eosinophils Relative: 1 %
Immature Granulocytes: 1 %
Lymphocytes Relative: 4 %
Lymphs Abs: 0.6 10*3/uL — ABNORMAL LOW (ref 0.7–4.0)
Monocytes Absolute: 0.9 10*3/uL (ref 0.1–1.0)
Monocytes Relative: 6 %
Neutro Abs: 14.2 10*3/uL — ABNORMAL HIGH (ref 1.7–7.7)
Neutrophils Relative %: 88 %

## 2019-10-28 LAB — COMPREHENSIVE METABOLIC PANEL
ALT: 27 U/L (ref 0–44)
AST: 30 U/L (ref 15–41)
Albumin: 4.1 g/dL (ref 3.5–5.0)
Alkaline Phosphatase: 83 U/L (ref 38–126)
Anion gap: 15 (ref 5–15)
BUN: 107 mg/dL — ABNORMAL HIGH (ref 8–23)
CO2: 26 mmol/L (ref 22–32)
Calcium: 8.8 mg/dL — ABNORMAL LOW (ref 8.9–10.3)
Chloride: 100 mmol/L (ref 98–111)
Creatinine, Ser: 5.8 mg/dL — ABNORMAL HIGH (ref 0.61–1.24)
GFR calc Af Amer: 11 mL/min — ABNORMAL LOW (ref >60)
GFR calc non Af Amer: 9 mL/min — ABNORMAL LOW (ref >60)
Glucose, Bld: 202 mg/dL — ABNORMAL HIGH (ref 70–99)
Potassium: 3.1 mmol/L — ABNORMAL LOW (ref 3.5–5.1)
Sodium: 141 mmol/L (ref 135–145)
Total Bilirubin: 0.7 mg/dL (ref 0.3–1.2)
Total Protein: 7.7 g/dL (ref 6.5–8.1)

## 2019-10-28 LAB — PROTIME-INR
INR: 1.1 (ref 0.8–1.2)
Prothrombin Time: 13.4 seconds (ref 11.4–15.2)

## 2019-10-28 LAB — CBC
HCT: 31.9 % — ABNORMAL LOW (ref 39.0–52.0)
Hemoglobin: 10.9 g/dL — ABNORMAL LOW (ref 13.0–17.0)
MCH: 30.1 pg (ref 26.0–34.0)
MCHC: 34.2 g/dL (ref 30.0–36.0)
MCV: 88.1 fL (ref 80.0–100.0)
Platelets: 246 10*3/uL (ref 150–400)
RBC: 3.62 MIL/uL — ABNORMAL LOW (ref 4.22–5.81)
RDW: 12.6 % (ref 11.5–15.5)
WBC: 16 10*3/uL — ABNORMAL HIGH (ref 4.0–10.5)
nRBC: 0 % (ref 0.0–0.2)

## 2019-10-28 LAB — APTT: aPTT: 23 seconds — ABNORMAL LOW (ref 24–36)

## 2019-10-28 MED ORDER — FENTANYL CITRATE (PF) 100 MCG/2ML IJ SOLN: 50.0000 ug | Freq: Once | INTRAMUSCULAR | Status: DC

## 2019-10-28 MED ORDER — CLEVIDIPINE BUTYRATE 0.5 MG/ML IV EMUL
0.0000 mg/h | INTRAVENOUS | Status: DC
  Administered 2019-10-28: 4 mg/h via INTRAVENOUS

## 2019-10-28 MED ORDER — SODIUM CHLORIDE 0.9% FLUSH: 3.0000 mL | Freq: Once | INTRAVENOUS | Status: DC

## 2019-10-30 LAB — CBG MONITORING, ED: Glucose-Capillary: 195 mg/dL — ABNORMAL HIGH (ref 70–99)

## 2019-11-04 NOTE — ED Triage Notes (Signed)
Patient arrived via EMS; concern for stroke; d/t left sided deficits, patient is also non-verbal but follows commands.

## 2019-11-04 NOTE — Consult Note (Signed)
NEURO HOSPITALIST CONSULT NOTE   Requesting Physician: Dr. Regenia Skeeter    Chief Complaint: Code Stroke called by EMS  History obtained from:  Chart, EMS and Wife  HPI:                                                                                                                                         Eric Russell is an 64 y.o. male brought in by EMS as a Code Stroke. His last known well was 8:40am when his wife left for church. When she returned home from church she found him on the floor next to the recliner around 1PM. Upon arrival his airway status was poor and he was obtunded, however it was clearly communicated to EMS by his wife that he would not want intubation. Initial examination by neurology identified no movement to his left side and an inital NIHSS Score of 18.   Head CT showed a very large right intracerebral hemorrhage extending into the ventricles, with midline shift of 27mm. Findings were concerning for early obstructive hydrocephalus.   The ED Provider, Dr. Regenia Skeeter spoke to the patients wife with neurology present. She shared that recently dialysis had been recommended and he decided that he did not want to have any dialysis treatments. His wife shared that they knew this meant end of life was near and that they were probably going to pursue hospice. The findings of large intracerebral hemorrhage were shared with his wife and she was asked if she wanted to continue treatments, such as blood pressure management or if she wanted to stop aggressive medical treatment and focus on comfort care. She shared that he would not want aggressive treatment such as intubation, but requested that his blood pressure continue to be managed until she was able to contact family, in hopes this would provide more time. She wanted to contact his mother and her son. It was communicated that despite blood pressure management that he may not survive and she verbalized understanding of the  situation. She agreed to have the palliative care team consulted to speak with her.   Date last known well: Date: 25-Nov-2019 Time last known well: Time: 08:40 tPA Given: No: This patient was not a candidate for tPA due to large ICH.           Intracerebral Hemorrhage (ICH) Score Total: 2  Past Medical History:  Diagnosis Date  . Anemia   . Arthritis   . Cancer [facility])    prostate  . Chronic kidney disease   . Complication of anesthesia    stopped breathing during gallbladder and knee surgery  . Diabetes mellitus without complication ([place])   . Hypertension   . Sleep apnea    uses CPAP    Past Surgical History:  Procedure Laterality Date  . CHOLECYSTECTOMY    . MOHS SURGERY    . TOTAL HIP ARTHROPLASTY Left 09/21/2018  Procedure: TOTAL HIP ARTHROPLASTY ANTERIOR APPROACH;  Surgeon: Hessie Knows, MD;  Location: [facility] ORS;  Service: Orthopedics;  Laterality: Left;  . TOTAL KNEE ARTHROPLASTY Right 06/29/2016   Procedure: TOTAL KNEE ARTHROPLASTY;  Surgeon: Hessie Knows, MD;  Location: [facility] ORS;  Service: Orthopedics;  Laterality: Right;    No family history on file.       Social History:  reports that he has never smoked. He has never used smokeless tobacco. He reports that he does not drink alcohol and does not use drugs.  Allergies:  Allergies  Allergen Reactions  . Tramadol Nausea And Vomiting and Other (See Comments)    Hiccups, tachycardia Cant sleep, irritable, hiccups per patient   Cant sleep, irritable, hiccups per patient       Medications:                                                                                                                           I have reviewed the patient's current medications.  ROS:                                                                                                                                       History obtained from unobtainable from patient due to mental status  General Examination:                                                                                                       Blood pressure (!) 162/75, pulse 83, temperature (!) 96.8 F (36 C), temperature source Rectal, resp. rate 23, height 5\' 8"  (1.727 m), weight (!) 116.7 kg, SpO2 95 %.  Physical Exam  Constitutional: Appears well-developed and well-nourished.  Psych: obtunded  Eyes: Normal external eye and conjunctiva. HENT: Normocephalic, no lesions, without obvious abnormality.   Musculoskeletal- obtunded  Cardiovascular: Normal rate and regular rhythm.  Respiratory: poor airway status (with wishes for no intubation) GI: Soft.  Skin: WDI  Neurological Examination Mental  Status: Obtunded, not speaking, not following commands.   Initial NIHSS: 18 1a LOC: 0 1b Month/Age: 58 1c open close eyes/hand: 0 2 Best Gaze: 0 3 Visual Fields: 1 4 Facial Palsy: 2 5a Motor left arm: 4 5b Motor Right arm: 0 6a Motor left leg: 4 6b Motor right leg: 0 7 limb ataxia: 0 8 Sensory: 2 9 Best Language: 0 10 Dysarthria: 2 11 Extinction/Innatention: 0  Lab Results: Basic Metabolic Panel: Recent Labs  Lab 04-Nov-2019 1347 Nov 04, 2019 1419  NA 141 141  K 3.3* 3.1*  CL 101 100  CO2  --  26  GLUCOSE 208* 202*  BUN 118* 107*  CREATININE 6.40* 5.80*  CALCIUM  --  8.8*    CBC: Recent Labs  Lab 11-04-2019 1347 04-Nov-2019 1419  WBC  --  16.0*  NEUTROABS  --  14.2*  HGB 10.9* 10.9*  HCT 32.0* 31.9*  MCV  --  88.1  PLT  --  246    Lipid Panel: No results for input(s): CHOL, TRIG, HDL, CHOLHDL, VLDL, LDLCALC in the last 168 hours.  CBG: No results for input(s): GLUCAP in the last 168 hours.  Imaging: CT HEAD CODE STROKE WO CONTRAST  Result Date: 04-Nov-2019 CLINICAL DATA:  Code stroke. Found unresponsive. Left-sided weakness. EXAM: CT HEAD WITHOUT CONTRAST TECHNIQUE: Contiguous axial images were obtained from the base of the skull through the vertex without intravenous contrast. COMPARISON:  CT head without contrast 10/19/2017 FINDINGS:  Brain: A large right hemispheric parenchymal hematoma measures 4.0 x 8.5 x 5.4 cm. Surrounding vasogenic edema is present. Hemorrhage extends into the ventricles. The posterior left lateral ventricle and temporal tip is somewhat ballooned. Blood extends into the fourth ventricle. Midline shift measures 9 mm. No separate extra-axial collection is present. Cerebellum is otherwise unremarkable. A left occipital infarct is age indeterminate. Vascular: No hyperdense vessel or unexpected calcification. Skull: Insert normal skull No significant extracranial soft tissue lesion is present. Sinuses/Orbits: Scattered mucosal thickening is present throughout the maxillary sinuses bilaterally. Anterior right ethmoid and frontal sinus mucosal thickening is present. No fluid levels are present. The sphenoid sinuses are clear bilaterally. IMPRESSION: 1. Large right hemispheric parenchymal hematoma measures 4.0 x 8.5 x 5.4 cm. Estimated volume is greater than 90 ml. 2. Surrounding vasogenic edema is present. 3. Hemorrhage extends into the ventricles. 4. The posterior left lateral ventricle and temporal tip is somewhat ballooned. This is concerning for early obstructive hydrocephalus. 5. Midline shift measures 9 mm. 6. Age indeterminate left occipital infarct. The above was relayed via text pager to Dr. Lorrin Goodell on 2019/11/04 at 14:03 . Electronically Signed   By: San Morelle M.D.   On: 11-04-19 14:03     Assessment: 64 y.o. male presenting as an EMS Code Stroke with no movement to his left side, NIHSS initially 18. ICH Score 2. Poor airway status, however wife clearly communicated to EMS that he was not to be intubated. Head CT identified a large intracerebral hemorrhage (estimated volume was greater than 59ml) with extension into the ventricles, midline shift and early obstructive hydrocephalus. The findings were discussed with his wife and she wanted to continue blood pressure management until she was able to speak  with family, however did want a palliative care consult. She understood that despite blood pressure management that he may not survive until other family members arrived.    Dr. Regenia Skeeter communicated with the neurology team an update that while awaiting palliative care consult and family arrival, the patient had quickly decompensated and  time of death was 14:50, about 1 hour from his time of arrival in the emergency department.   Gwenyth Bouillon FNP-C [facility]  Attending neurologist to follow with review of consult note.   12-Nov-2019, 4:59 PM

## 2019-11-04 NOTE — ED Provider Notes (Signed)
[name] Provider Note   CSN: [id number] Arrival date & time: 29-Oct-2019  1339  An emergency department physician performed an initial assessment on this suspected stroke patient at 1340.  LEVEL 5 CAVEAT - ALTERED MENTAL STATUS  History Chief Complaint  Patient presents with  . Code Stroke    Eric Russell is a 64 y.o. male.  HPI 64 year old male brought in as a code stroke.  Last seen normal around 8:30 AM.  Patient did not answer the phone when the wife called after church and she found him on the floor next to the recliner.  Patient has been altered and very hypertensive for EMS.  They talk to the wife and given his poor airway status, wife has let them know that he was previously made a DNR but she does not have official paperwork.  He would not want intubation.  Otherwise, he stopped vomiting for EMS after being given Zofran.  Further history is pretty limited as the patient is obtunded.   Past Medical History:  Diagnosis Date  . Anemia   . Arthritis   . Cancer [facility])    prostate  . Chronic kidney disease   . Complication of anesthesia    stopped breathing during gallbladder and knee surgery  . Diabetes mellitus without complication ([place])   . Hypertension   . Sleep apnea    uses CPAP    Patient Active Problem List   Diagnosis Date Noted  . Anemia associated with chronic renal failure 06/29/2019  . Iron deficiency anemia 06/22/2019  . History of fracture of left hip 11/15/2018  . CKD (chronic kidney disease) stage 4, GFR 15-29 ml/min (HCC) 10/05/2018  . Hip fracture ([place]) 09/19/2018  . Anemia in stage 3 chronic kidney disease 12/16/2017  . Benign essential hypertension 12/16/2017  . Hypokalemia 12/16/2017  . Obesity 12/16/2017  . Type 2 diabetes mellitus with stage 3 chronic kidney disease, without long-term current use of insulin ([place]) 12/16/2017  . Vitamin D deficiency 12/16/2017  . Status post right knee replacement  09/28/2017  . Primary localized osteoarthritis of right knee 06/29/2016  . Macroalbuminuric diabetic nephropathy ([place]) 03/09/2016  . OSA on CPAP 08/27/2014  . Prostate cancer ([place]) 05/30/2014    Past Surgical History:  Procedure Laterality Date  . CHOLECYSTECTOMY    . MOHS SURGERY    . TOTAL HIP ARTHROPLASTY Left 09/21/2018   Procedure: TOTAL HIP ARTHROPLASTY ANTERIOR APPROACH;  Surgeon: Hessie Knows, MD;  Location: [facility] ORS;  Service: Orthopedics;  Laterality: Left;  . TOTAL KNEE ARTHROPLASTY Right 06/29/2016   Procedure: TOTAL KNEE ARTHROPLASTY;  Surgeon: Hessie Knows, MD;  Location: [facility] ORS;  Service: Orthopedics;  Laterality: Right;       No family history on file.  Social History   Tobacco Use  . Smoking status: Never Smoker  . Smokeless tobacco: Never Used  Vaping Use  . Vaping Use: Never used  Substance Use Topics  . Alcohol use: No  . Drug use: No    Home Medications Prior to Admission medications   Medication Sig Start Date End Date Taking? Authorizing Provider  acetaminophen (TYLENOL) 650 MG CR tablet Take 650 mg by mouth every 8 (eight) hours as needed for pain.    [provider]  amLODipine (NORVASC) 5 MG tablet Take 5 mg by mouth daily.    [provider]  carvedilol (COREG) 25 MG tablet Take 50 mg by mouth 2 (two) times daily with a meal.  [provider]  ferrous gluconate (FERGON) 324 MG tablet Take 1 tablet by mouth daily. 10/18/18 10/18/19  [provider]  furosemide (LASIX) 20 MG tablet Take 20 mg by mouth daily as needed.     [provider]  glimepiride (AMARYL) 2 MG tablet Take 1.5 mg by mouth daily with breakfast.     [provider]  levocetirizine (XYZAL) 5 MG tablet Take 5 mg by mouth every evening.    [provider]  montelukast (SINGULAIR) 10 MG tablet Take 10 mg by mouth every morning.    [provider]  Multiple Vitamin (THERA) TABS Take 1 tablet by mouth daily.     [provider]  potassium chloride SA (KLOR-CON) 20 MEQ tablet Take 1 tablet by mouth daily. 06/20/19   [provider]  rivastigmine (EXELON) 3 MG capsule Take 3 mg by mouth 2 (two) times daily.    [provider]  simvastatin (ZOCOR) 10 MG tablet Take 10 mg by mouth every morning.  07/11/18   [provider]  triamterene-hydrochlorothiazide (MAXZIDE) 75-50 MG tablet Take 1 tablet by mouth every morning.     [provider]    Allergies    Tramadol  Review of Systems   Review of Systems  Unable to perform ROS: Mental status change    Physical Exam Updated Vital Signs BP (!) 162/75   Pulse 83   Temp (!) 96.8 F (36 C) (Rectal)   Resp 23   Ht 5\' 8"  (1.727 m)   Wt (!) 116.7 kg   SpO2 95%   BMI 39.12 kg/m   Physical Exam Vitals and nursing note reviewed.  Constitutional:      Appearance: He is well-developed. He is obese. He is ill-appearing.  HENT:     Head: Normocephalic and atraumatic.     Right Ear: External ear normal.     Left Ear: External ear normal.     Nose: Nose normal.  Eyes:     General:        Right eye: No discharge.        Left eye: No discharge.  Cardiovascular:     Rate and Rhythm: Normal rate and regular rhythm.     Heart sounds: Normal heart sounds.  Pulmonary:     Effort: Accessory muscle usage present.  Abdominal:     Palpations: Abdomen is soft.     Tenderness: There is no abdominal tenderness.  Musculoskeletal:     Cervical back: Neck supple.  Skin:    General: Skin is warm and dry.  Neurological:     Mental Status: He is lethargic.     Comments: Patient awakens to voice/palpation. He tries to speak but speech is unknown eligible.  Moves right upper and lower extremities but does not move left.  Psychiatric:        Mood and Affect: Mood is not anxious.     ED Results / Procedures / Treatments   Labs (all labs ordered are listed, but only abnormal results are displayed) Labs Reviewed  APTT  - Abnormal; Notable for the following components:      Result Value   aPTT 23 (*)    All other components within normal limits  CBC - Abnormal; Notable for the following components:   WBC 16.0 (*)    RBC 3.62 (*)    Hemoglobin 10.9 (*)    HCT 31.9 (*)    All other components within normal limits  DIFFERENTIAL - Abnormal;  Notable for the following components:   Neutro Abs 14.2 (*)    Lymphs Abs 0.6 (*)    Abs Immature Granulocytes 0.09 (*)    All other components within normal limits  COMPREHENSIVE METABOLIC PANEL - Abnormal; Notable for the following components:   Potassium 3.1 (*)    Glucose, Bld 202 (*)    BUN 107 (*)    Creatinine, Ser 5.80 (*)    Calcium 8.8 (*)    GFR calc non Af Amer 9 (*)    GFR calc Af Amer 11 (*)    All other components within normal limits  I-STAT CHEM 8, ED - Abnormal; Notable for the following components:   Potassium 3.3 (*)    BUN 118 (*)    Creatinine, Ser 6.40 (*)    Glucose, Bld 208 (*)    Calcium, Ion 1.02 (*)    Hemoglobin 10.9 (*)    HCT 32.0 (*)    All other components within normal limits  SARS CORONAVIRUS 2 BY RT PCR (HOSPITAL ORDER, [facility])  PROTIME-INR  CBG MONITORING, ED    EKG EKG Interpretation  Date/Time:  2019/11/19 13:56:30 EDT Ventricular Rate:  77 PR Interval:    QRS Duration: 138 QT Interval:  463 QTC Calculation: 507 R Axis:   -19 Text Interpretation: Sinus rhythm Paired ventricular premature complexes Prolonged PR interval Consider left atrial enlargement Probable left ventricular hypertrophy Prolonged QT interval Poor data quality in current ECG precludes serial comparison Confirmed by Sherwood Gambler [phone]) on 11-19-2019 2:05:38 PM   Radiology CT HEAD CODE STROKE WO CONTRAST  Result Date: November 19, 2019 CLINICAL DATA:  Code stroke. Found unresponsive. Left-sided weakness. EXAM: CT HEAD WITHOUT CONTRAST TECHNIQUE: Contiguous axial images were obtained from the base of the  skull through the vertex without intravenous contrast. COMPARISON:  CT head without contrast 10/19/2017 FINDINGS: Brain: A large right hemispheric parenchymal hematoma measures 4.0 x 8.5 x 5.4 cm. Surrounding vasogenic edema is present. Hemorrhage extends into the ventricles. The posterior left lateral ventricle and temporal tip is somewhat ballooned. Blood extends into the fourth ventricle. Midline shift measures 9 mm. No separate extra-axial collection is present. Cerebellum is otherwise unremarkable. A left occipital infarct is age indeterminate. Vascular: No hyperdense vessel or unexpected calcification. Skull: Insert normal skull No significant extracranial soft tissue lesion is present. Sinuses/Orbits: Scattered mucosal thickening is present throughout the maxillary sinuses bilaterally. Anterior right ethmoid and frontal sinus mucosal thickening is present. No fluid levels are present. The sphenoid sinuses are clear bilaterally. IMPRESSION: 1. Large right hemispheric parenchymal hematoma measures 4.0 x 8.5 x 5.4 cm. Estimated volume is greater than 90 ml. 2. Surrounding vasogenic edema is present. 3. Hemorrhage extends into the ventricles. 4. The posterior left lateral ventricle and temporal tip is somewhat ballooned. This is concerning for early obstructive hydrocephalus. 5. Midline shift measures 9 mm. 6. Age indeterminate left occipital infarct. The above was relayed via text pager to Dr. Lorrin Goodell on 11-19-19 at 14:03 . Electronically Signed   By: San Morelle M.D.   On: 11/19/2019 14:03    Procedures .Critical Care Performed by: Sherwood Gambler, MD Authorized by: Sherwood Gambler, MD   Critical care provider statement:    Critical care time (minutes):  45   Critical care time was exclusive of:  Separately billable procedures and treating other patients   Critical care was necessary to treat or prevent imminent or life-threatening deterioration of the following conditions:  CNS failure  or compromise and respiratory failure   Critical care was time spent personally by me on the following activities:  Discussions with consultants, evaluation of patient's response to treatment, examination of patient, ordering and performing treatments and interventions, ordering and review of laboratory studies, ordering and review of radiographic studies, pulse oximetry, re-evaluation of patient's condition, obtaining history from patient or surrogate and review of old charts   (including critical care time)  Medications Ordered in ED Medications  sodium chloride flush (NS) 0.9 % injection 3 mL (has no administration in time range)  clevidipine (CLEVIPREX) infusion 0.5 mg/mL (4 mg/hr Intravenous New Bag/Given 11/24/19 1416)  fentaNYL (SUBLIMAZE) injection 50 mcg (has no administration in time range)    ED Course  I have reviewed the triage vital signs and the nursing notes.  Pertinent labs & imaging results that were available during my care of the patient were reviewed by me and considered in my medical decision making (see chart for details).    MDM Rules/Calculators/A&P                          I discussed with the wife very shortly after the patient's arrival and she confirms the patient would not want to be intubated, have CPR, or any significant interventions such as decompression/surgery.  Due to this, he was taken to the CT scanner without airway control and quick CT of the head shows large intraparenchymal hemorrhage.  I discussed multiple times with the patient with the neurologist present about how his outcome is likely to be very bad and she would like him to be made comfortable.  At first she would like blood pressure control if this would partially help.  Otherwise he was given fentanyl for perceived headache and discomfort.  While in the emergency department, the patient quickly decompensated and became apneic and then pulseless.  Time of death 1450. Final Clinical Impression(s)  / ED Diagnoses Final diagnoses:  None    Rx / DC Orders ED Discharge Orders    None       Sherwood Gambler, MD 11/24/2019 1539

## 2019-11-04 DEATH — deceased

## 2020-01-07 ENCOUNTER — Other Ambulatory Visit: Payer: BC Managed Care – PPO

## 2020-01-08 ENCOUNTER — Telehealth: Payer: BC Managed Care – PPO | Admitting: Oncology
